# Patient Record
Sex: Male | Born: 1960 | Race: Black or African American | Hispanic: No | Marital: Single | Smoking: Never smoker
Health system: Southern US, Community
[De-identification: ages and names within clinical notes are randomized; demographics above are authoritative.]

## PROBLEM LIST (undated history)

## (undated) DIAGNOSIS — I5023 Acute on chronic systolic (congestive) heart failure: Secondary | ICD-10-CM

## (undated) DIAGNOSIS — I10 Essential (primary) hypertension: Secondary | ICD-10-CM

## (undated) DIAGNOSIS — I4891 Unspecified atrial fibrillation: Secondary | ICD-10-CM

## (undated) DIAGNOSIS — E669 Obesity, unspecified: Secondary | ICD-10-CM

## (undated) DIAGNOSIS — I420 Dilated cardiomyopathy: Secondary | ICD-10-CM

## (undated) HISTORY — DX: Unspecified atrial fibrillation: I48.91

## (undated) HISTORY — DX: Acute on chronic systolic (congestive) heart failure: I50.23

## (undated) HISTORY — DX: Obesity, unspecified: E66.9

## (undated) HISTORY — DX: Dilated cardiomyopathy: I42.0

---

## 2013-11-16 ENCOUNTER — Encounter (HOSPITAL_COMMUNITY): Payer: Self-pay | Admitting: Emergency Medicine

## 2013-11-16 ENCOUNTER — Emergency Department (HOSPITAL_COMMUNITY)
Admission: EM | Admit: 2013-11-16 | Discharge: 2013-11-16 | Disposition: A | Discharge status: Home / Self Care | Payer: Self-pay | Attending: Emergency Medicine | Admitting: Emergency Medicine

## 2013-11-16 DIAGNOSIS — L0291 Cutaneous abscess, unspecified: Secondary | ICD-10-CM | POA: Insufficient documentation

## 2013-11-16 DIAGNOSIS — L0390 Cellulitis, unspecified: Secondary | ICD-10-CM | POA: Insufficient documentation

## 2013-11-16 DIAGNOSIS — I10 Essential (primary) hypertension: Secondary | ICD-10-CM | POA: Insufficient documentation

## 2013-11-16 HISTORY — DX: Essential (primary) hypertension: I10

## 2013-11-16 MED ORDER — CEPHALEXIN 500 MG PO CAPS: 500.0000 mg | ORAL_CAPSULE | Freq: Four times a day (QID) | ORAL | Status: DC

## 2013-11-16 MED ORDER — SULFAMETHOXAZOLE-TRIMETHOPRIM 800-160 MG PO TABS: 1.0000 | ORAL_TABLET | Freq: Two times a day (BID) | ORAL | Status: DC

## 2013-11-16 NOTE — ED Provider Notes (Signed)
CSN: [id number]     Arrival date & time 11/16/13  1013 History  This chart was scribed for non-physician practitioner Emilia BeckKaitlyn Tavis Kring, PA-C working with Dagmar HaitWilliam Blair Walden, MD by Dorothey Basemania Sutton, ED Scribe. This patient was seen in room TR08C/TR08C and the patient's care was started at 11:26 AM.    Chief Complaint  Patient presents with  . Insect Bite   Patient is a 53 y.o. male presenting with rash. The history is provided by the patient. No language interpreter was used.  Rash Location:  Torso and leg Torso rash location:  L chest, R chest, abd LUQ, abd LLQ, abd RUQ and abd RLQ Leg rash location:  L leg and R leg Quality: painful and redness   Pain details:    Severity:  Mild   Onset quality:  Gradual   Timing:  Constant   Progression:  Unchanged Severity:  Moderate Onset quality:  Gradual Timing:  Constant Progression:  Unchanged Chronicity:  New Context: exposure to similar rash   Relieved by:  None tried Ineffective treatments:  None tried  HPI Comments: Thomasene LotJames Gigante is a 53 y.o. male who presents to the Emergency Department complaining of multiple, painful, erythematous bumps to the chest, abdomen, and bilateral legs onset a few days ago. He reports squeezing the areas at home with a small amount of purulent drainage expelled. Patient reports that his girlfriend who shares a bed with him has also had a similar rash on her legs. Patient has a history of HTN.   Past Medical History  Diagnosis Date  . Hypertension    No past surgical history on file. No family history on file. History  Substance Use Topics  . Smoking status: Not on file  . Smokeless tobacco: Not on file  . Alcohol Use: Not on file    Review of Systems  Skin: Positive for color change (erythema) and rash.  All other systems reviewed and are negative.  Allergies  Review of patient's allergies indicates no known allergies.  Home Medications   Prior to Admission medications   Not on File   Triage  Vitals: BP 135/80  Pulse 61  Temp(Src) 97.8 F (36.6 C) (Oral)  Resp 16  Ht 6\' 4"  (1.93 m)  Wt 250 lb (113.399 kg)  BMI 30.44 kg/m2  SpO2 94%  Physical Exam  Nursing note and vitals reviewed. Constitutional: He is oriented to person, place, and time. He appears well-developed and well-nourished. No distress.  HENT:  Head: Normocephalic and atraumatic.  Eyes: Conjunctivae are normal.  Neck: Normal range of motion. Neck supple.  Pulmonary/Chest: Effort normal. No respiratory distress.  Abdominal: He exhibits no distension.  Musculoskeletal: Normal range of motion.  Neurological: He is alert and oriented to person, place, and time.  Skin: Skin is warm and dry. There is erythema.  3 lesions of anterior left thigh that are erythematous and tender to palpation with associated induration. No fluctuance noted. Similar area of central chest.   Psychiatric: He has a normal mood and affect. His behavior is normal.    ED Course  Procedures (including critical care time)  DIAGNOSTIC STUDIES: Oxygen Saturation is 94% on room air, adequate by my interpretation.    COORDINATION OF CARE: 11:28 AM- Discussed that the areas will not need to be incised and drained at this time. Will start patient on a course of antibiotics. Advised of further symptomatic care at home. Discussed treatment plan with patient at bedside and patient verbalized agreement.    Labs  Review Labs Reviewed - No data to display  Imaging Review No results found.   EKG Interpretation None      MDM   Final diagnoses:  Cellulitis    Patient appears to have scattered areas of induration with overlying scabbing from the patient draining the areas at home. No fluctuance noted. Patient will be discharged with Bactrim and Keflex. Vitals stable and patient afebrile.   I personally performed the services described in this documentation, which was scribed in my presence. The recorded information has been reviewed and is  accurate.     Emilia Beck, [place] 11/17/13 [phone]

## 2013-11-16 NOTE — ED Notes (Addendum)
Pt thought he had ingrown hair and tried to pop bumps; reports very painful under the skin lesions. On stomach and chest and legs. GF who sleeps in bed with him has several on her legs. Pt has been picking and pushing on them to make them drain.

## 2013-11-16 NOTE — Discharge Instructions (Signed)
Take Bactrim and Keflex as directed until gone. Refer to attached documents for more information. Return to the ED with worsening or concerning symptoms.  °

## 2013-11-18 NOTE — ED Provider Notes (Signed)
Medical screening examination/treatment/procedure(s) were performed by non-physician practitioner and as supervising physician I was immediately available for consultation/collaboration.   EKG Interpretation None        Dagmar Hait, MD 11/18/13 1901

## 2015-06-24 LAB — HM HIV SCREENING LAB: HM HIV SCREENING: NEGATIVE

## 2016-03-23 DIAGNOSIS — I5023 Acute on chronic systolic (congestive) heart failure: Secondary | ICD-10-CM

## 2016-03-23 HISTORY — DX: Acute on chronic systolic (congestive) heart failure: I50.23

## 2016-03-27 ENCOUNTER — Encounter (HOSPITAL_COMMUNITY): Payer: Self-pay

## 2016-03-27 ENCOUNTER — Emergency Department (HOSPITAL_COMMUNITY): Payer: Medicaid - Out of State

## 2016-03-27 ENCOUNTER — Inpatient Hospital Stay (HOSPITAL_COMMUNITY)
Admission: EM | Admit: 2016-03-27 | Discharge: 2016-04-02 | DRG: 291 | Disposition: A | Discharge status: Home / Self Care | Payer: Medicaid - Out of State | Attending: Cardiovascular Disease | Admitting: Cardiovascular Disease

## 2016-03-27 DIAGNOSIS — E876 Hypokalemia: Secondary | ICD-10-CM | POA: Diagnosis present

## 2016-03-27 DIAGNOSIS — I481 Persistent atrial fibrillation: Secondary | ICD-10-CM | POA: Diagnosis present

## 2016-03-27 DIAGNOSIS — I110 Hypertensive heart disease with heart failure: Principal | ICD-10-CM | POA: Diagnosis present

## 2016-03-27 DIAGNOSIS — I4891 Unspecified atrial fibrillation: Secondary | ICD-10-CM

## 2016-03-27 DIAGNOSIS — R0602 Shortness of breath: Secondary | ICD-10-CM

## 2016-03-27 DIAGNOSIS — Z6831 Body mass index (BMI) 31.0-31.9, adult: Secondary | ICD-10-CM

## 2016-03-27 DIAGNOSIS — J189 Pneumonia, unspecified organism: Secondary | ICD-10-CM | POA: Diagnosis present

## 2016-03-27 DIAGNOSIS — I428 Other cardiomyopathies: Secondary | ICD-10-CM | POA: Diagnosis present

## 2016-03-27 DIAGNOSIS — T50906A Underdosing of unspecified drugs, medicaments and biological substances, initial encounter: Secondary | ICD-10-CM | POA: Diagnosis present

## 2016-03-27 DIAGNOSIS — R0600 Dyspnea, unspecified: Secondary | ICD-10-CM

## 2016-03-27 DIAGNOSIS — Z91128 Patient's intentional underdosing of medication regimen for other reason: Secondary | ICD-10-CM

## 2016-03-27 DIAGNOSIS — I420 Dilated cardiomyopathy: Secondary | ICD-10-CM | POA: Diagnosis present

## 2016-03-27 DIAGNOSIS — I10 Essential (primary) hypertension: Secondary | ICD-10-CM

## 2016-03-27 DIAGNOSIS — I5023 Acute on chronic systolic (congestive) heart failure: Secondary | ICD-10-CM | POA: Diagnosis present

## 2016-03-27 DIAGNOSIS — E669 Obesity, unspecified: Secondary | ICD-10-CM | POA: Diagnosis present

## 2016-03-27 LAB — CBC
HEMATOCRIT: 47 % (ref 39.0–52.0)
Hemoglobin: 16.1 g/dL (ref 13.0–17.0)
MCH: 30.5 pg (ref 26.0–34.0)
MCHC: 34.3 g/dL (ref 30.0–36.0)
MCV: 89 fL (ref 78.0–100.0)
PLATELETS: 217 10*3/uL (ref 150–400)
RBC: 5.28 MIL/uL (ref 4.22–5.81)
RDW: 13.8 % (ref 11.5–15.5)
WBC: 4.7 10*3/uL (ref 4.0–10.5)

## 2016-03-27 LAB — I-STAT ARTERIAL BLOOD GAS, ED
Bicarbonate: 22.3 mmol/L (ref 20.0–28.0)
O2 Saturation: 96 %
PO2 ART: 75 mmHg — AB (ref 83.0–108.0)
Patient temperature: 98.7
TCO2: 23 mmol/L (ref 0–100)
pCO2 arterial: 31 mmHg — ABNORMAL LOW (ref 32.0–48.0)
pH, Arterial: 7.464 — ABNORMAL HIGH (ref 7.350–7.450)

## 2016-03-27 LAB — HEPATIC FUNCTION PANEL
ALBUMIN: 3.7 g/dL (ref 3.5–5.0)
ALT: 50 U/L (ref 17–63)
AST: 45 U/L — ABNORMAL HIGH (ref 15–41)
Alkaline Phosphatase: 86 U/L (ref 38–126)
BILIRUBIN TOTAL: 1.2 mg/dL (ref 0.3–1.2)
Bilirubin, Direct: 0.3 mg/dL (ref 0.1–0.5)
Indirect Bilirubin: 0.9 mg/dL (ref 0.3–0.9)
Total Protein: 6.8 g/dL (ref 6.5–8.1)

## 2016-03-27 LAB — I-STAT TROPONIN, ED: TROPONIN I, POC: 0 ng/mL (ref 0.00–0.08)

## 2016-03-27 LAB — HEPARIN LEVEL (UNFRACTIONATED): Heparin Unfractionated: 0.16 IU/mL — ABNORMAL LOW (ref 0.30–0.70)

## 2016-03-27 LAB — BASIC METABOLIC PANEL
Anion gap: 10 (ref 5–15)
BUN: 13 mg/dL (ref 6–20)
CO2: 23 mmol/L (ref 22–32)
CREATININE: 1.3 mg/dL — AB (ref 0.61–1.24)
Calcium: 9.1 mg/dL (ref 8.9–10.3)
Chloride: 107 mmol/L (ref 101–111)
GFR calc Af Amer: >60 mL/min (ref >60)
GFR, EST NON AFRICAN AMERICAN: 60 mL/min — AB (ref >60)
GLUCOSE: 97 mg/dL (ref 65–99)
POTASSIUM: 3.5 mmol/L (ref 3.5–5.1)
Sodium: 140 mmol/L (ref 135–145)

## 2016-03-27 LAB — MRSA PCR SCREENING: MRSA by PCR: POSITIVE — AB

## 2016-03-27 LAB — PROTIME-INR
INR: 1.05
PROTHROMBIN TIME: 13.7 s (ref 11.4–15.2)

## 2016-03-27 LAB — D-DIMER, QUANTITATIVE (NOT AT ARMC): D DIMER QUANT: 1.54 ug{FEU}/mL — AB (ref 0.00–0.50)

## 2016-03-27 LAB — TROPONIN I
Troponin I: <0.03 ng/mL
Troponin I: <0.03 ng/mL (ref <0.03)

## 2016-03-27 MED ORDER — ACETAMINOPHEN 325 MG PO TABS
650.0000 mg | ORAL_TABLET | ORAL | Status: DC | PRN
  Administered 2016-03-27 – 2016-03-31 (×7): 650 mg via ORAL
  Filled 2016-03-27 (×7): qty 2

## 2016-03-27 MED ORDER — DILTIAZEM HCL 25 MG/5ML IV SOLN
10.0000 mg | Freq: Once | INTRAVENOUS | Status: AC
  Administered 2016-03-27: 10 mg via INTRAVENOUS
  Filled 2016-03-27: qty 5

## 2016-03-27 MED ORDER — HEPARIN BOLUS VIA INFUSION
5500.0000 [IU] | Freq: Once | INTRAVENOUS | Status: AC
  Administered 2016-03-27: 5500 [IU] via INTRAVENOUS
  Filled 2016-03-27: qty 5500

## 2016-03-27 MED ORDER — METOPROLOL TARTRATE 5 MG/5ML IV SOLN
5.0000 mg | Freq: Once | INTRAVENOUS | Status: AC
  Administered 2016-03-27: 5 mg via INTRAVENOUS
  Filled 2016-03-27: qty 5

## 2016-03-27 MED ORDER — SODIUM CHLORIDE 0.9 % IV BOLUS (SEPSIS)
500.0000 mL | Freq: Once | INTRAVENOUS | Status: AC
  Administered 2016-03-27: 500 mL via INTRAVENOUS

## 2016-03-27 MED ORDER — ORAL CARE MOUTH RINSE
15.0000 mL | Freq: Two times a day (BID) | OROMUCOSAL | Status: DC
  Administered 2016-03-28 – 2016-03-31 (×5): 15 mL via OROMUCOSAL

## 2016-03-27 MED ORDER — MILRINONE LACTATE IN DEXTROSE 20-5 MG/100ML-% IV SOLN
0.1250 ug/kg/min | INTRAVENOUS | Status: DC
  Administered 2016-03-27 – 2016-03-30 (×5): 0.125 ug/kg/min via INTRAVENOUS
  Filled 2016-03-27 (×5): qty 100

## 2016-03-27 MED ORDER — AZITHROMYCIN 250 MG PO TABS
500.0000 mg | ORAL_TABLET | ORAL | Status: AC
  Administered 2016-03-27 – 2016-03-29 (×3): 500 mg via ORAL
  Filled 2016-03-27 (×4): qty 2

## 2016-03-27 MED ORDER — DIGOXIN 0.25 MG/ML IJ SOLN: 0.2500 mg | Freq: Every day | INTRAMUSCULAR | Status: DC

## 2016-03-27 MED ORDER — DEXTROSE 5 % IV SOLN
1.0000 g | INTRAVENOUS | Status: DC
  Administered 2016-03-27 – 2016-04-01 (×6): 1 g via INTRAVENOUS
  Filled 2016-03-27 (×6): qty 10

## 2016-03-27 MED ORDER — CHLORHEXIDINE GLUCONATE CLOTH 2 % EX PADS
6.0000 | MEDICATED_PAD | Freq: Every day | CUTANEOUS | Status: AC
  Administered 2016-03-28 – 2016-04-01 (×5): 6 via TOPICAL

## 2016-03-27 MED ORDER — HEPARIN (PORCINE) IN NACL 100-0.45 UNIT/ML-% IJ SOLN: 1000.0000 [IU]/h | INTRAMUSCULAR | Status: DC

## 2016-03-27 MED ORDER — SODIUM CHLORIDE 0.9 % IV SOLN: 250.0000 mL | INTRAVENOUS | Status: DC | PRN

## 2016-03-27 MED ORDER — ONDANSETRON HCL 4 MG/2ML IJ SOLN
4.0000 mg | Freq: Four times a day (QID) | INTRAMUSCULAR | Status: DC | PRN
  Administered 2016-03-29: 4 mg via INTRAVENOUS
  Filled 2016-03-27 (×2): qty 2

## 2016-03-27 MED ORDER — DILTIAZEM HCL 100 MG IV SOLR
5.0000 mg/h | Freq: Once | INTRAVENOUS | Status: DC
  Filled 2016-03-27: qty 100

## 2016-03-27 MED ORDER — HYDRALAZINE HCL 25 MG PO TABS
25.0000 mg | ORAL_TABLET | Freq: Four times a day (QID) | ORAL | Status: DC
  Administered 2016-03-27 – 2016-03-28 (×4): 25 mg via ORAL
  Filled 2016-03-27 (×4): qty 1

## 2016-03-27 MED ORDER — IOPAMIDOL (ISOVUE-370) INJECTION 76%
INTRAVENOUS | Status: AC
  Administered 2016-03-27: 80 mL
  Filled 2016-03-27: qty 100

## 2016-03-27 MED ORDER — NITROGLYCERIN IN D5W 200-5 MCG/ML-% IV SOLN
0.0000 ug/min | INTRAVENOUS | Status: DC
  Administered 2016-03-27: 5 ug/min via INTRAVENOUS
  Administered 2016-03-28: 60 ug/min via INTRAVENOUS
  Administered 2016-03-29: 15 ug/min via INTRAVENOUS
  Filled 2016-03-27 (×3): qty 250

## 2016-03-27 MED ORDER — HEPARIN (PORCINE) IN NACL 100-0.45 UNIT/ML-% IJ SOLN
2150.0000 [IU]/h | INTRAMUSCULAR | Status: DC
  Administered 2016-03-27: 1600 [IU]/h via INTRAVENOUS
  Administered 2016-03-28: 1900 [IU]/h via INTRAVENOUS
  Administered 2016-03-29 – 2016-03-31 (×4): 2150 [IU]/h via INTRAVENOUS
  Filled 2016-03-27 (×9): qty 250

## 2016-03-27 MED ORDER — HEPARIN BOLUS VIA INFUSION: 4000.0000 [IU] | Freq: Once | INTRAVENOUS | Status: DC

## 2016-03-27 MED ORDER — FUROSEMIDE 10 MG/ML IJ SOLN
40.0000 mg | Freq: Two times a day (BID) | INTRAMUSCULAR | Status: DC
  Administered 2016-03-27 – 2016-03-28 (×2): 40 mg via INTRAVENOUS
  Filled 2016-03-27 (×2): qty 4

## 2016-03-27 MED ORDER — SODIUM CHLORIDE 0.9% FLUSH
3.0000 mL | Freq: Two times a day (BID) | INTRAVENOUS | Status: DC
  Administered 2016-03-27 – 2016-04-01 (×6): 3 mL via INTRAVENOUS

## 2016-03-27 MED ORDER — MUPIROCIN 2 % EX OINT
1.0000 "application " | TOPICAL_OINTMENT | Freq: Two times a day (BID) | CUTANEOUS | Status: AC
  Administered 2016-03-28 – 2016-04-01 (×10): 1 via NASAL
  Filled 2016-03-27 (×5): qty 22

## 2016-03-27 MED ORDER — SODIUM CHLORIDE 0.9% FLUSH: 3.0000 mL | INTRAVENOUS | Status: DC | PRN

## 2016-03-27 NOTE — ED Notes (Signed)
Report attempted 

## 2016-03-27 NOTE — Progress Notes (Signed)
BP 163/114 at 2230. HR afib rate controlled 85-105 and patient denies any SOB. Titration of Nitro gtt not effecting BP. Received scheduled hydralazine also. Patient now has headache 5/10 which tylenol given for. Advised Dr Algie Coffer who stated that he was okay with BP at that level. Patient bothered by nasal cannula and requests mask instead. Will trial of oxygen.

## 2016-03-27 NOTE — Progress Notes (Signed)
ANTICOAGULATION CONSULT NOTE - Initial Consult  Pharmacy Consult for Heparin Indication: atrial fibrillation (r/o PE)  No Known Allergies  Patient Measurements: Height: 6' 3.98" (193 cm) Weight: 267 lb (121.1 kg) IBW/kg (Calculated) : 86.76 Heparin Dosing Weight: 112.2  Vital Signs: Temp: 98.7 F (37.1 C) (10/05 1022) Temp Source: Oral (10/05 1022) BP: 171/139 (10/05 1200) Pulse Rate: 28 (10/05 1200)  Labs:  Recent Labs  03/27/16 1025  HGB 16.1  HCT 47.0  PLT 217  LABPROT 13.7  INR 1.05  CREATININE 1.30*    Estimated Creatinine Clearance: 90.2 mL/min (by C-G formula based on SCr of 1.3 mg/dL (H)).   Medical History: Past Medical History:  Diagnosis Date  . Hypertension     Medications:   (Not in a hospital admission)  Assessment: Pt has dx Afib and admits to poor compliance of anticoag meds. Last took warfarin 2 weeks ago. SOB of breath may be d/t new onset PE, will r/o with chest CT. Anticoagulation dosing based on Afib and potential PE. H/H, Plt WNL. D-dimer elevated at 1.54. INR WNL.   Goal of Therapy:  Heparin level 0.3-0.7 units/ml Monitor platelets by anticoagulation protocol: Yes   Plan:  Give 5500 units bolus x 1  Heparin infusion 1600 units/hr Obtain HL @ 2200 Obtain daily HL, CBC Monitor for s/sx bleeding  Lianne Cure 03/27/2016,1:37 PM

## 2016-03-27 NOTE — ED Triage Notes (Addendum)
Pt has been taking valsartan and metoprolol x 6-7 months after dx w/ HTN by PCP. Pt c/o shortness of breath starting 1 week ago. Pt states he looked online and saw shortness of breath as side effect of medications, stopped taking meds and did not consult with PCP. Pt hypertensive upon arrival to ED. Unable to sleep d/t difficulty breathing. Symptoms worse w/ exertion. Denies CP/cough/fever/chills. Hx afib.

## 2016-03-27 NOTE — H&P (Signed)
Referring Physician:  [name] is an 55 y.o. male.                       Chief Complaint: Shortness of breath  HPI: 55 year old male with hypertension, dilated cardiomyopathy who was cardioverted for atrial fibrillation 2 weeks ago has stopped his Beta blocker due to shortness of breath. He is back in atrial fibrillation and has increased dyspnea x 1 week. Chest x-ray showed cardiomegaly and CT angio-chest showed pulmonary edema and cardiomegaly.  Past Medical History:  Diagnosis Date  . Hypertension       History reviewed. No pertinent surgical history.  History reviewed. No pertinent family history. Social History:  reports that he has never smoked. He has never used smokeless tobacco. He reports that he does not drink alcohol or use drugs.  Allergies: No Known Allergies   (Not in a hospital admission)  Results for orders placed or performed during the hospital encounter of 03/27/16 (from the past 48 hour(s))  Basic metabolic panel     Status: Abnormal   Collection Time: 03/27/16 10:25 AM  Result Value Ref Range   Sodium 140 135 - 145 mmol/L   Potassium 3.5 3.5 - 5.1 mmol/L   Chloride 107 101 - 111 mmol/L   CO2 23 22 - 32 mmol/L   Glucose, Bld 97 65 - 99 mg/dL   BUN 13 6 - 20 mg/dL   Creatinine, Ser 1.30 (H) 0.61 - 1.24 mg/dL   Calcium 9.1 8.9 - 10.3 mg/dL   GFR calc non Af Amer 60 (L) >60 mL/min   GFR calc Af Amer >60 >60 mL/min    Comment: (NOTE) The eGFR has been calculated using the CKD EPI equation. This calculation has not been validated in all clinical situations. eGFR's persistently <60 mL/min signify possible Chronic Kidney Disease.    Anion gap 10 5 - 15  CBC     Status: None   Collection Time: 03/27/16 10:25 AM  Result Value Ref Range   WBC 4.7 4.0 - 10.5 K/uL   RBC 5.28 4.22 - 5.81 MIL/uL   Hemoglobin 16.1 13.0 - 17.0 g/dL   HCT 47.0 39.0 - 52.0 %   MCV 89.0 78.0 - 100.0 fL   MCH 30.5 26.0 - 34.0 pg   MCHC 34.3 30.0 - 36.0 g/dL   RDW 13.8 11.5 -  15.5 %   Platelets 217 150 - 400 K/uL  Hepatic function panel     Status: Abnormal   Collection Time: 03/27/16 10:25 AM  Result Value Ref Range   Total Protein 6.8 6.5 - 8.1 g/dL   Albumin 3.7 3.5 - 5.0 g/dL   AST 45 (H) 15 - 41 U/L   ALT 50 17 - 63 U/L   Alkaline Phosphatase 86 38 - 126 U/L   Total Bilirubin 1.2 0.3 - 1.2 mg/dL   Bilirubin, Direct 0.3 0.1 - 0.5 mg/dL   Indirect Bilirubin 0.9 0.3 - 0.9 mg/dL  Protime-INR     Status: None   Collection Time: 03/27/16 10:25 AM  Result Value Ref Range   Prothrombin Time 13.7 11.4 - 15.2 seconds   INR 1.05   D-dimer, quantitative (not at [facility])     Status: Abnormal   Collection Time: 03/27/16 10:25 AM  Result Value Ref Range   D-Dimer, Quant 1.54 (H) 0.00 - 0.50 ug/mL-FEU    Comment: (NOTE) At the manufacturer cut-off of 0.50 ug/mL FEU, this assay has been documented to  exclude PE with a sensitivity and negative predictive value of 97 to 99%.  At this time, this assay has not been approved by the FDA to exclude DVT/VTE. Results should be correlated with clinical presentation.   I-stat troponin, ED     Status: None   Collection Time: 03/27/16 11:00 AM  Result Value Ref Range   Troponin i, poc 0.00 0.00 - 0.08 ng/mL   Comment 3            Comment: Due to the release kinetics of cTnI, a negative result within the first hours of the onset of symptoms does not rule out myocardial infarction with certainty. If myocardial infarction is still suspected, repeat the test at appropriate intervals.   I-Stat Arterial Blood Gas, ED - (order at [facility] and [facility] only)     Status: Abnormal   Collection Time: 03/27/16  1:15 PM  Result Value Ref Range   pH, Arterial 7.464 (H) 7.350 - 7.450   pCO2 arterial 31.0 (L) 32.0 - 48.0 mmHg   pO2, Arterial 75.0 (L) 83.0 - 108.0 mmHg   Bicarbonate 22.3 20.0 - 28.0 mmol/L   TCO2 23 0 - 100 mmol/L   O2 Saturation 96.0 %   Patient temperature 98.7 F    Collection site RADIAL, ALLEN'S TEST ACCEPTABLE    Drawn  by Operator    Sample type ARTERIAL    Dg Chest Portable 1 View  Result Date: 03/27/2016 CLINICAL DATA:  Short of breath for several months EXAM: PORTABLE CHEST 1 VIEW COMPARISON:  None. FINDINGS: No pneumonia or effusion is seen. It is difficult to exclude very minimal interstitial prominence, but some of this prominence most likely is due to technique and inspiration. If further assessment is warranted CT of the chest would be recommended. Mediastinal and hilar contours are unremarkable. The heart is mildly enlarged. No bony abnormality is seen. IMPRESSION: 1. No definite active process. Doubt slight prominence of interstitial markings but consider CT of the chest if warranted. 2. Cardiomegaly. Electronically Signed   By: Ivar Drape M.D.   On: 03/27/2016 10:43    Review Of Systems Constitutional: No fever or chills Respiratory: Positive Shortness of breath. No asthma or COPD. Cardiovascular: Positive Hypertension, atrial fibrillation and CHF. GI: No hepatitis, GI bleed or nausea, vomiting or abdominal pain GU: No hematuria or dysuria.  Skin: No rash. Neurology: No headache, dizziness, stroke or seizures. Psychiatry: No anxiety or depression.  Blood pressure (!) 171/139, pulse (!) 28, temperature 98.7 F (37.1 C), temperature source Oral, resp. rate 23, height 6' 3.98" (1.93 m), weight 121.1 kg (267 lb), SpO2 93 %. Body mass index is 32.51 kg/m. General appearance: alert, cooperative, appears stated age and severe distress Head: Normocephalic, without obvious abnormality, atraumatic Eyes: conjunctivae/corneas clear. PERRL, EOM's intact. Fundi benign. Neck: no adenopathy, no carotid bruit, + JVD, supple, symmetrical, trachea midline and thyroid not enlarged. Resp: crackles to auscultation bilaterally Cardio: Irregular rate and rhythm, S1, S2 normal, no click, rub or gallop. II/VI systolic murmur. Extremities: No cyanosis. Positive trace edema Skin: Warm and dry. No rashes or  lesions Neurologic: Alert and oriented X 3, normal strength and tone. Moves all four extremities.  Assessment/Plan Acute on chronic systolic heart failure Atrial fibrillation with RVR to moderate control, CHADVASC score of 2 Hypertension Dilated cardiomyopathy  Admit to ICU with NTG and milrinone drip. Small dose B-blocker or diltiazem as needed for rate control.  Birdie Riddle, MD  03/27/2016, 2:34 PM

## 2016-03-27 NOTE — ED Provider Notes (Addendum)
[facility]-EMERGENCY DEPT Provider Note   CSN: [id number] Arrival date & time: 03/27/16  1005     History   Chief Complaint Chief Complaint  Patient presents with  . Shortness of Breath    HPI Dwayne Ramirez is a 55 y.o. male.  HPI  The 55 year old man history of hypertension, A. fib who presents today stating that he has had increasing dyspnea over the past week. He reports baseline dyspnea for a number of years. He states that he quit taking his blood pressure medicines a week ago because they were not doing him any good and he continued to be short of breath. He reports that he has had atrial fibrillation and [place] 2 [place]. He states that he was taken off of blood thinners at that time. He states he has had some cough but he has not had running a fever or had a productive cough. He denies smoking or drug use.  Past Medical History:  Diagnosis Date  . Hypertension     There are no active problems to display for this patient.   History reviewed. No pertinent surgical history.     Home Medications    Prior to Admission medications   Medication Sig Start Date End Date Taking? Authorizing Provider  cephALEXin (KEFLEX) 500 MG capsule Take 1 capsule (500 mg total) by mouth 4 (four) times daily. 11/16/13   Kaitlyn Szekalski, PA-C  sulfamethoxazole-trimethoprim (SEPTRA DS) 800-160 MG per tablet Take 1 tablet by mouth every 12 (twelve) hours. 11/16/13   Emilia BeckKaitlyn Szekalski, PA-C    Family History History reviewed. No pertinent family history.  Social History Social History  Substance Use Topics  . Smoking status: Never Smoker  . Smokeless tobacco: Never Used  . Alcohol use No     Allergies   Review of patient's allergies indicates no known allergies.   Review of Systems Review of Systems   Physical Exam Updated Vital Signs BP (!) 176/142 (BP Location: Left Arm)   Pulse 66   Temp 98.7 F (37.1 C) (Oral)   Resp 23   SpO2 95%   Physical  Exam  Constitutional: He is oriented to person, place, and time. He appears well-developed and well-nourished.  HENT:  Head: Normocephalic and atraumatic.  Right Ear: External ear normal.  Left Ear: External ear normal.  Nose: Nose normal.  Eyes: EOM are normal.  Neck: No tracheal deviation present.  Cardiovascular: Normal rate, regular rhythm, normal heart sounds and intact distal pulses.   Pulmonary/Chest: Effort normal.  Abdominal: Soft. Bowel sounds are normal.  Musculoskeletal: Normal range of motion. He exhibits no edema.  Neurological: He is alert and oriented to person, place, and time.  Skin: Skin is warm and dry.  Psychiatric: He has a normal mood and affect. His behavior is normal.  Nursing note and vitals reviewed.    ED Treatments / Results  Labs (all labs ordered are listed, but only abnormal results are displayed) Labs Reviewed  BASIC METABOLIC PANEL  CBC  HEPATIC FUNCTION PANEL  PROTIME-INR  I-STAT TROPOININ, ED    EKG  EKG Interpretation  Date/Time:  Thursday March 27 2016 10:17:44 EDT Ventricular Rate:  117 PR Interval:    QRS Duration: 103 QT Interval:  336 QTC Calculation: 469 R Axis:   -53 Text Interpretation:  Atrial fibrillation Incomplete RBBB and LAFB Abnormal R-wave progression, early transition TW inversions in leads I, V5, V6, possibly rate-dependent or due to repolarization abnormalities No ST elevations or depressions  No old tracing to compare Confirmed by [facility] MD, CAMERON [phone]) on 03/27/2016 10:23:05 AM       Radiology Dg Chest Portable 1 View  Result Date: 03/27/2016 CLINICAL DATA:  Short of breath for several months EXAM: PORTABLE CHEST 1 VIEW COMPARISON:  None. FINDINGS: No pneumonia or effusion is seen. It is difficult to exclude very minimal interstitial prominence, but some of this prominence most likely is due to technique and inspiration. If further assessment is warranted CT of the chest would be recommended. Mediastinal  and hilar contours are unremarkable. The heart is mildly enlarged. No bony abnormality is seen. IMPRESSION: 1. No definite active process. Doubt slight prominence of interstitial markings but consider CT of the chest if warranted. 2. Cardiomegaly. Electronically Signed   By: Dwyane Dee M.D.   On: 03/27/2016 10:43    Procedures Procedures (including critical care time)  Medications Ordered in ED Medications  metoprolol (LOPRESSOR) injection 5 mg (not administered)     Initial Impression / Assessment and Plan / ED Course  I have reviewed the triage vital signs and the nursing notes.  Pertinent labs & imaging results that were available during my care of the patient were reviewed by me and considered in my medical decision making (see chart for details).  Clinical Course   1- a fib rvr- patient with rate decreased to 90s here before lopressor, lopressor iv given.  Will need cardioversion or rate control agent.  Discussed need for anticoagulation and patient states he does not want tobe on blood thinner.  Did agree to initial anticoagulation while continuing work up.  CHA2Ds2-VASc Score for Atrial Fibrillation    Patient Score  Age <65 = 0 65-74 = 1 > 75 = 2 0  Sex Male = 0 Male = 1 0  CHF History No = 0  Yes = 1 0  HTN History No = 0  Yes = 1 1  Stroke/TIA/TE History No = 0  Yes = 1 0  Vascular Disease History No = 0  Yes = 1 0  Diabetes History No = 0  Yes = 1 0  Total:  1      2- dyspnea- #1 could be contributing, but no overt signs of chf and patient with sats as low as 87%.  Plan ct angio to assess for pe with elevated d-dimer.  Patient denies smoking but states has been wheezing.  CXR without pulmonary edema- but could have some mild increased fluid contributing to dyspnea 3- hypertension 4 noncompliance with meds- lpatient with complaints of meds causing symptoms, but history unclear if symptoms from underlying disease or meds.  Appears challenging to explain  physiology/pharmacotherapy and obtain patient compliance.   Discussed with Dr. Algie Coffer and will start cardizem.  He will see and assumes care.  1:23 PM   CRITICAL CARE Performed by: Hilario Quarry Total critical care time: 60 minutes Critical care time was exclusive of separately billable procedures and treating other patients. Critical care was necessary to treat or prevent imminent or life-threatening deterioration. Critical care was time spent personally by me on the following activities: development of treatment plan with patient and/or surrogate as well as nursing, discussions with consultants, evaluation of patient's response to treatment, examination of patient, obtaining history from patient or surrogate, ordering and performing treatments and interventions, ordering and review of laboratory studies, ordering and review of radiographic studies, pulse oximetry and re-evaluation of patient's condition.  Final Clinical Impressions(s) / ED Diagnoses   Final diagnoses:  Atrial fibrillation with RVR (HCC)  Dyspnea, unspecified type  Hypertension, unspecified type    New Prescriptions New Prescriptions   No medications on file     Margarita Grizzle, MD 03/27/16 1324    Margarita Grizzle, MD 03/27/16 1538

## 2016-03-27 NOTE — ED Notes (Signed)
Report called  

## 2016-03-27 NOTE — Progress Notes (Signed)
ANTICOAGULATION CONSULT NOTE - Follow Up Consult  Pharmacy Consult for heparin  Indication: atrial fibrillation (r/o PE)  No Known Allergies  Patient Measurements: Height: 6' 3.98" (193 cm) Weight: 264 lb 12.4 oz (120.1 kg) IBW/kg (Calculated) : 86.76 Heparin Dosing Weight: 112.2 kg  Vital Signs: Temp: 97.8 F (36.6 C) (10/05 1723) Temp Source: Oral (10/05 1723) BP: 172/147 (10/05 2000) Pulse Rate: 104 (10/05 2000)  Labs:  Recent Labs  03/27/16 1025 03/27/16 1548 03/27/16 1954  HGB 16.1  --   --   HCT 47.0  --   --   PLT 217  --   --   LABPROT 13.7  --   --   INR 1.05  --   --   HEPARINUNFRC  --   --  0.16*  CREATININE 1.30*  --   --   TROPONINI  --  <0.03  --     Estimated Creatinine Clearance: 89.8 mL/min (by C-G formula based on SCr of 1.3 mg/dL (H)).   Medications:  Scheduled:  . azithromycin  500 mg Oral Q24H  . cefTRIAXone (ROCEPHIN)  IV  1 g Intravenous Q24H  . furosemide  40 mg Intravenous BID  . hydrALAZINE  25 mg Oral Q6H  . mouth rinse  15 mL Mouth Rinse BID  . sodium chloride flush  3 mL Intravenous Q12H   Infusions:  . heparin 1,600 Units/hr (03/27/16 1440)  . milrinone 0.125 mcg/kg/min (03/27/16 1625)  . nitroGLYCERIN 25 mcg/min (03/27/16 1859)    Assessment: 55 yo male with afib (r/o PE) is currently on subtherapeutic heparin. Heparin level is 0.16. No problem with infusion per RN.  Goal of Therapy:  Heparin level 0.3-0.7 units/ml Monitor platelets by anticoagulation protocol: Yes   Plan:  - increase heparin drip to 1900 units/hr - 6hr heparin level  Jaine Estabrooks, Tsz-Yin 03/27/2016,8:37 PM

## 2016-03-27 NOTE — ED Notes (Signed)
Maurice Small, RN verified Heparin when started

## 2016-03-27 NOTE — Progress Notes (Signed)
Patient's wife brought patient drink from home. Reviewed 1200 ml fluid restriction and unit visitation policy. Discussed medications patient was taking at hospital with wife and patient. Wife appears more receptive to teaching, asking questions while patient watched television. Explained the importance of fluid restriction and patient stated "If I drink more I will just pee it out".

## 2016-03-28 ENCOUNTER — Inpatient Hospital Stay (HOSPITAL_COMMUNITY): Payer: Medicaid - Out of State

## 2016-03-28 LAB — HEPARIN LEVEL (UNFRACTIONATED)
HEPARIN UNFRACTIONATED: 0.48 [IU]/mL (ref 0.30–0.70)
HEPARIN UNFRACTIONATED: 0.4 [IU]/mL (ref 0.30–0.70)
Heparin Unfractionated: 0.24 IU/mL — ABNORMAL LOW (ref 0.30–0.70)

## 2016-03-28 LAB — CBC
HCT: 43.9 % (ref 39.0–52.0)
Hemoglobin: 14.6 g/dL (ref 13.0–17.0)
MCH: 29.6 pg (ref 26.0–34.0)
MCHC: 33.3 g/dL (ref 30.0–36.0)
MCV: 88.9 fL (ref 78.0–100.0)
PLATELETS: 176 10*3/uL (ref 150–400)
RBC: 4.94 MIL/uL (ref 4.22–5.81)
RDW: 13.5 % (ref 11.5–15.5)
WBC: 5.8 10*3/uL (ref 4.0–10.5)

## 2016-03-28 LAB — TROPONIN I

## 2016-03-28 LAB — BASIC METABOLIC PANEL
ANION GAP: 10 (ref 5–15)
BUN: 16 mg/dL (ref 6–20)
CALCIUM: 8.8 mg/dL — AB (ref 8.9–10.3)
CO2: 25 mmol/L (ref 22–32)
CREATININE: 1.35 mg/dL — AB (ref 0.61–1.24)
Chloride: 105 mmol/L (ref 101–111)
GFR, EST NON AFRICAN AMERICAN: 57 mL/min — AB (ref >60)
Glucose, Bld: 100 mg/dL — ABNORMAL HIGH (ref 65–99)
Potassium: 3.2 mmol/L — ABNORMAL LOW (ref 3.5–5.1)
SODIUM: 140 mmol/L (ref 135–145)

## 2016-03-28 LAB — ECHOCARDIOGRAM COMPLETE
HEIGHTINCHES: 75.984 in
WEIGHTICAEL: 4204.8 [oz_av]

## 2016-03-28 MED ORDER — HYDRALAZINE HCL 50 MG PO TABS
50.0000 mg | ORAL_TABLET | Freq: Three times a day (TID) | ORAL | Status: DC
  Administered 2016-03-28 – 2016-03-29 (×3): 50 mg via ORAL
  Filled 2016-03-28 (×3): qty 1

## 2016-03-28 MED ORDER — FUROSEMIDE 10 MG/ML IJ SOLN
40.0000 mg | Freq: Every day | INTRAMUSCULAR | Status: DC
  Administered 2016-03-29 – 2016-04-01 (×4): 40 mg via INTRAVENOUS
  Filled 2016-03-28 (×4): qty 4

## 2016-03-28 MED ORDER — OXYCODONE HCL 5 MG PO TABS
5.0000 mg | ORAL_TABLET | Freq: Four times a day (QID) | ORAL | Status: DC | PRN
  Administered 2016-03-28 – 2016-03-31 (×3): 5 mg via ORAL
  Filled 2016-03-28 (×5): qty 1

## 2016-03-28 MED ORDER — POTASSIUM CHLORIDE CRYS ER 20 MEQ PO TBCR
20.0000 meq | EXTENDED_RELEASE_TABLET | ORAL | Status: AC
  Administered 2016-03-28 (×3): 20 meq via ORAL
  Filled 2016-03-28 (×2): qty 1

## 2016-03-28 MED ORDER — POTASSIUM CHLORIDE CRYS ER 20 MEQ PO TBCR
20.0000 meq | EXTENDED_RELEASE_TABLET | ORAL | Status: AC
  Administered 2016-03-28 – 2016-03-29 (×2): 20 meq via ORAL
  Filled 2016-03-28 (×2): qty 1

## 2016-03-28 MED ORDER — LOSARTAN POTASSIUM 25 MG PO TABS
25.0000 mg | ORAL_TABLET | Freq: Every day | ORAL | Status: DC
  Administered 2016-03-28 – 2016-03-29 (×2): 25 mg via ORAL
  Filled 2016-03-28 (×2): qty 1

## 2016-03-28 MED ORDER — SPIRONOLACTONE 25 MG PO TABS
12.5000 mg | ORAL_TABLET | Freq: Two times a day (BID) | ORAL | Status: DC
Start: 2016-03-28 — End: 2016-04-02
  Administered 2016-03-28 – 2016-04-02 (×10): 12.5 mg via ORAL
  Filled 2016-03-28 (×10): qty 1

## 2016-03-28 MED ORDER — FUROSEMIDE 10 MG/ML IJ SOLN
40.0000 mg | Freq: Once | INTRAMUSCULAR | Status: AC
  Administered 2016-03-28: 40 mg via INTRAVENOUS
  Filled 2016-03-28: qty 4

## 2016-03-28 MED ORDER — DILTIAZEM HCL 30 MG PO TABS
30.0000 mg | ORAL_TABLET | Freq: Four times a day (QID) | ORAL | Status: DC
  Administered 2016-03-28 (×2): 30 mg via ORAL
  Filled 2016-03-28 (×2): qty 1

## 2016-03-28 MED ORDER — POTASSIUM CHLORIDE CRYS ER 10 MEQ PO TBCR
EXTENDED_RELEASE_TABLET | ORAL | Status: AC
  Filled 2016-03-28: qty 2

## 2016-03-28 MED ORDER — DILTIAZEM HCL 60 MG PO TABS
60.0000 mg | ORAL_TABLET | Freq: Four times a day (QID) | ORAL | Status: DC
  Administered 2016-03-29 (×2): 60 mg via ORAL
  Filled 2016-03-28 (×2): qty 1

## 2016-03-28 NOTE — Progress Notes (Signed)
ANTICOAGULATION CONSULT NOTE - Follow Up Consult  Pharmacy Consult for heparin  Indication: atrial fibrillation  No Known Allergies  Patient Measurements: Height: 6' 3.98" (193 cm) Weight: 264 lb 12.4 oz (120.1 kg) IBW/kg (Calculated) : 86.76 Heparin Dosing Weight: 112.2 kg  Vital Signs: Temp: 98 F (36.7 C) (10/05 2300) Temp Source: Oral (10/05 2100) BP: 144/98 (10/06 0200) Pulse Rate: 60 (10/06 0200)  Labs:  Recent Labs  03/27/16 1025 03/27/16 1548 03/27/16 1954 03/28/16 0231  HGB 16.1  --   --  14.6  HCT 47.0  --   --  43.9  PLT 217  --   --  176  LABPROT 13.7  --   --   --   INR 1.05  --   --   --   HEPARINUNFRC  --   --  0.16* 0.24*  CREATININE 1.30*  --   --   --   TROPONINI  --  <0.03 <0.03 <0.03    Estimated Creatinine Clearance: 89.8 mL/min (by C-G formula based on SCr of 1.3 mg/dL (H)).  Assessment: 55 yo male on heparin for afib. No PE seen on CT scan. Heparin level remains subtherapeutic (0.24) on 1900 units/hr. No issues with line or bleeding reported per RN.  Goal of Therapy:  Heparin level 0.3-0.7 units/ml Monitor platelets by anticoagulation protocol: Yes   Plan:  - increase heparin drip to 2150 units/hr - F/u 6 hr heparin level  Christoper Fabian, PharmD, BCPS Clinical pharmacist, pager [phone] 03/28/2016,3:38 AM

## 2016-03-28 NOTE — Progress Notes (Signed)
Pt continued on contact precautions for positive MRSA pcr from previous shift. Family educated about contact gowns, infection control policy and procedures. Protocol initiated on 10/5.

## 2016-03-28 NOTE — Progress Notes (Signed)
ANTICOAGULATION CONSULT NOTE - Follow Up Consult  Pharmacy Consult for heparin Indication: atrial fibrillation  No Known Allergies  Patient Measurements: Height: 6' 3.98" (193 cm) Weight: 262 lb 12.8 oz (119.2 kg) IBW/kg (Calculated) : 86.76 Heparin Dosing Weight: 112.2 kg  Vital Signs: Temp: 98.1 F (36.7 C) (10/06 1207) Temp Source: Oral (10/06 1207) BP: 148/101 (10/06 1500) Pulse Rate: 97 (10/06 1500)  Labs:  Recent Labs  03/27/16 1025 03/27/16 1548 03/27/16 1954 03/28/16 0231 03/28/16 1454  HGB 16.1  --   --  14.6  --   HCT 47.0  --   --  43.9  --   PLT 217  --   --  176  --   LABPROT 13.7  --   --   --   --   INR 1.05  --   --   --   --   HEPARINUNFRC  --   --  0.16* 0.24* 0.48  CREATININE 1.30*  --   --  1.35*  --   TROPONINI  --  <0.03 <0.03 <0.03  --     Estimated Creatinine Clearance: 86.2 mL/min (by C-G formula based on SCr of 1.35 mg/dL (H)).   Medications:  Scheduled:  . azithromycin  500 mg Oral Q24H  . cefTRIAXone (ROCEPHIN)  IV  1 g Intravenous Q24H  . Chlorhexidine Gluconate Cloth  6 each Topical Q0600  . diltiazem  30 mg Oral Q6H  . [START ON 03/29/2016] furosemide  40 mg Intravenous Daily  . hydrALAZINE  50 mg Oral Q8H  . mouth rinse  15 mL Mouth Rinse BID  . mupirocin ointment  1 application Nasal BID  . sodium chloride flush  3 mL Intravenous Q12H   Infusions:  . heparin 2,150 Units/hr (03/28/16 1200)  . milrinone 0.125 mcg/kg/min (03/28/16 1200)  . nitroGLYCERIN 30 mcg/min (03/28/16 1435)    Assessment: 55 yo male with afib is currently on therapeutic heparin.  Heparin level is 0.48.   Goal of Therapy:  Heparin level 0.3-0.7 units/ml Monitor platelets by anticoagulation protocol: Yes   Plan:  - continue heparin at 2150 units/hr - repeat in 6hrs to confirm  Javelle Donigan, Tsz-Yin 03/28/2016,3:30 PM

## 2016-03-28 NOTE — Progress Notes (Signed)
ANTICOAGULATION CONSULT NOTE - Follow Up Consult  Pharmacy Consult for heparin Indication: atrial fibrillation  Labs:  Recent Labs  03/27/16 1025 03/27/16 1548  03/27/16 1954 03/28/16 0231 03/28/16 1454 03/28/16 2147  HGB 16.1  --   --   --  14.6  --   --   HCT 47.0  --   --   --  43.9  --   --   PLT 217  --   --   --  176  --   --   LABPROT 13.7  --   --   --   --   --   --   INR 1.05  --   --   --   --   --   --   HEPARINUNFRC  --   --   < > 0.16* 0.24* 0.48 0.40  CREATININE 1.30*  --   --   --  1.35*  --   --   TROPONINI  --  <0.03  --  <0.03 <0.03  --   --   < > = values in this interval not displayed.   Assessment/Plan:  55yo male remains therapeutic on heparin. Will continue gtt at current rate and daily levels.   Vernard Gambles, PharmD, BCPS  03/28/2016,11:35 PM

## 2016-03-28 NOTE — Progress Notes (Signed)
Pt BP 167/114. Notified Dr. Algie Coffer, received verbal orders for diltiazem 30mg  PO q6hr.

## 2016-03-28 NOTE — Progress Notes (Signed)
Echocardiogram 2D Echocardiogram has been performed.  Dwayne Ramirez 03/28/2016, 10:51 AM

## 2016-03-28 NOTE — Progress Notes (Signed)
Ref: Pcp Not In System   Subjective:  Breathing better. Echocardiogram showed mild inferior and lateral wall hypokinesia with 40-45 % EF, mild MR and Severe TR.  Objective:  Vital Signs in the last 24 hours: Temp:  [97.8 F (36.6 C)-98.5 F (36.9 C)] 97.8 F (36.6 C) (10/06 2040) Pulse Rate:  [36-111] 97 (10/06 2040) Resp:  [11-27] 21 (10/06 2040) BP: (123-171)/(87-147) 164/96 (10/06 2040) SpO2:  [90 %-98 %] 97 % (10/06 2040) Weight:  [119.2 kg (262 lb 12.8 oz)] 119.2 kg (262 lb 12.8 oz) (10/06 0500)  Physical Exam: BP Readings from Last 1 Encounters:  03/28/16 (!) 164/96    Wt Readings from Last 1 Encounters:  03/28/16 119.2 kg (262 lb 12.8 oz)    Weight change:  Body mass index is 32 kg/m. HEENT: [place]/AT, Eyes-Brown, PERL, EOMI, Conjunctiva-Pink, Sclera-Non-icteric Neck: + JVD, No bruit, Trachea midline. Lungs:  Clearing, Bilateral. Cardiac: Irregular rhythm, normal S1 and S2, no S3. II/VI systolic murmur. Abdomen:  Soft, non-tender. Extremities:  No edema present. No cyanosis. No clubbing. CNS: AxOx3, Cranial nerves grossly intact, moves all 4 extremities.  Skin: Warm and dry.   Intake/Output from previous day: 10/05 0701 - 10/06 0700 In: 1691.2 [P.O.:580; I.V.:561.2; IV Piggyback:550] Out: 2775 [Urine:2775]    Lab Results: BMET    Component Value Date/Time   NA 140 03/28/2016 0231   NA 140 03/27/2016 1025   K 3.2 (L) 03/28/2016 0231   K 3.5 03/27/2016 1025   CL 105 03/28/2016 0231   CL 107 03/27/2016 1025   CO2 25 03/28/2016 0231   CO2 23 03/27/2016 1025   GLUCOSE 100 (H) 03/28/2016 0231   GLUCOSE 97 03/27/2016 1025   BUN 16 03/28/2016 0231   BUN 13 03/27/2016 1025   CREATININE 1.35 (H) 03/28/2016 0231   CREATININE 1.30 (H) 03/27/2016 1025   CALCIUM 8.8 (L) 03/28/2016 0231   CALCIUM 9.1 03/27/2016 1025   GFRNONAA 57 (L) 03/28/2016 0231   GFRNONAA 60 (L) 03/27/2016 1025   GFRAA >60 03/28/2016 0231   GFRAA >60 03/27/2016 1025   CBC    Component  Value Date/Time   WBC 5.8 03/28/2016 0231   RBC 4.94 03/28/2016 0231   HGB 14.6 03/28/2016 0231   HCT 43.9 03/28/2016 0231   PLT 176 03/28/2016 0231   MCV 88.9 03/28/2016 0231   MCH 29.6 03/28/2016 0231   MCHC 33.3 03/28/2016 0231   RDW 13.5 03/28/2016 0231   HEPATIC Function Panel  Recent Labs  03/27/16 1025  PROT 6.8   HEMOGLOBIN A1C No components found for: HGA1C,  MPG CARDIAC ENZYMES Lab Results  Component Value Date   TROPONINI <0.03 03/28/2016   TROPONINI <0.03 03/27/2016   TROPONINI <0.03 03/27/2016   BNP No results for input(s): PROBNP in the last 8760 hours. TSH No results for input(s): TSH in the last 8760 hours. CHOLESTEROL No results for input(s): CHOL in the last 8760 hours.  Scheduled Meds: . azithromycin  500 mg Oral Q24H  . cefTRIAXone (ROCEPHIN)  IV  1 g Intravenous Q24H  . Chlorhexidine Gluconate Cloth  6 each Topical Q0600  . diltiazem  30 mg Oral Q6H  . [START ON 03/29/2016] furosemide  40 mg Intravenous Daily  . furosemide  40 mg Intravenous Once  . hydrALAZINE  50 mg Oral Q8H  . mouth rinse  15 mL Mouth Rinse BID  . mupirocin ointment  1 application Nasal BID  . potassium chloride  20 mEq Oral Q2H  . sodium chloride  flush  3 mL Intravenous Q12H   Continuous Infusions: . heparin 2,150 Units/hr (03/28/16 1700)  . milrinone 0.125 mcg/kg/min (03/28/16 1700)  . nitroGLYCERIN 30 mcg/min (03/28/16 1700)   PRN Meds:.sodium chloride, acetaminophen, ondansetron (ZOFRAN) IV, oxyCODONE, sodium chloride flush  Assessment/Plan: Acute on chronic systolic left heart failure Atrial fibrillation with controlled ventricular response Hypertension Dilated cardiomyopathy Hypokalemia  Potassium replacement. Add spironolactone, Cardizem, hydralazine and losartan and adjust doses as needed and tolerated. Wean off IV NTG when possible. Decrease lasix dose from AM. Consider R+ L heart cath in 2-3 days.   LOS: 1 day    Orpah Cobb  MD  03/28/2016, 9:22  PM

## 2016-03-29 ENCOUNTER — Inpatient Hospital Stay (HOSPITAL_COMMUNITY): Payer: Medicaid - Out of State

## 2016-03-29 LAB — BASIC METABOLIC PANEL
Anion gap: 10 (ref 5–15)
Anion gap: 9 (ref 5–15)
BUN: 12 mg/dL (ref 6–20)
BUN: 12 mg/dL (ref 6–20)
CHLORIDE: 104 mmol/L (ref 101–111)
CHLORIDE: 106 mmol/L (ref 101–111)
CO2: 25 mmol/L (ref 22–32)
CO2: 22 mmol/L (ref 22–32)
Calcium: 9.1 mg/dL (ref 8.9–10.3)
Calcium: 9.1 mg/dL (ref 8.9–10.3)
Creatinine, Ser: 1.09 mg/dL (ref 0.61–1.24)
Creatinine, Ser: 1.22 mg/dL (ref 0.61–1.24)
GFR calc Af Amer: >60 mL/min (ref >60)
GFR calc non Af Amer: >60 mL/min (ref >60)
GFR calc non Af Amer: >60 mL/min (ref >60)
GLUCOSE: 131 mg/dL — AB (ref 65–99)
Glucose, Bld: 147 mg/dL — ABNORMAL HIGH (ref 65–99)
POTASSIUM: 2.8 mmol/L — AB (ref 3.5–5.1)
Potassium: 3.6 mmol/L (ref 3.5–5.1)
SODIUM: 139 mmol/L (ref 135–145)
Sodium: 137 mmol/L (ref 135–145)

## 2016-03-29 LAB — HEPARIN LEVEL (UNFRACTIONATED): HEPARIN UNFRACTIONATED: 0.43 [IU]/mL (ref 0.30–0.70)

## 2016-03-29 LAB — CBC
HEMATOCRIT: 45.5 % (ref 39.0–52.0)
HEMOGLOBIN: 15.7 g/dL (ref 13.0–17.0)
MCH: 30.5 pg (ref 26.0–34.0)
MCHC: 34.5 g/dL (ref 30.0–36.0)
MCV: 88.3 fL (ref 78.0–100.0)
Platelets: 220 10*3/uL (ref 150–400)
RBC: 5.15 MIL/uL (ref 4.22–5.81)
RDW: 13.4 % (ref 11.5–15.5)
WBC: 7.1 10*3/uL (ref 4.0–10.5)

## 2016-03-29 LAB — MAGNESIUM: Magnesium: 1.8 mg/dL (ref 1.7–2.4)

## 2016-03-29 MED ORDER — POTASSIUM CHLORIDE CRYS ER 20 MEQ PO TBCR
20.0000 meq | EXTENDED_RELEASE_TABLET | Freq: Two times a day (BID) | ORAL | Status: DC
  Administered 2016-03-29 – 2016-04-02 (×8): 20 meq via ORAL
  Filled 2016-03-29 (×4): qty 1
  Filled 2016-03-29: qty 2
  Filled 2016-03-29 (×3): qty 1

## 2016-03-29 MED ORDER — LOSARTAN POTASSIUM 50 MG PO TABS
50.0000 mg | ORAL_TABLET | Freq: Every day | ORAL | Status: DC
  Administered 2016-03-29: 50 mg via ORAL
  Filled 2016-03-29 (×2): qty 1

## 2016-03-29 MED ORDER — POTASSIUM CHLORIDE CRYS ER 20 MEQ PO TBCR
40.0000 meq | EXTENDED_RELEASE_TABLET | ORAL | Status: AC
  Administered 2016-03-29 (×2): 40 meq via ORAL
  Filled 2016-03-29 (×2): qty 2

## 2016-03-29 MED ORDER — METOPROLOL TARTRATE 50 MG PO TABS
50.0000 mg | ORAL_TABLET | Freq: Two times a day (BID) | ORAL | Status: DC
  Administered 2016-03-29 (×2): 50 mg via ORAL
  Filled 2016-03-29 (×3): qty 1

## 2016-03-29 NOTE — Progress Notes (Signed)
Subjective:  Denies any chest pain states breathing has improved. Diuresing well with IV Lasix and low-dose milrinone. Remains in A. fib with moderate ventricular response.  Objective:  Vital Signs in the last 24 hours: Temp:  [97.8 F (36.6 C)-98.2 F (36.8 C)] 98.1 F (36.7 C) (10/07 0900) Pulse Rate:  [62-112] 92 (10/07 1015) Resp:  [17-29] 20 (10/07 1015) BP: (107-186)/(77-129) 129/86 (10/07 1015) SpO2:  [93 %-98 %] 93 % (10/07 1015) Weight:  [253 lb 12 oz (115.1 kg)] 253 lb 12 oz (115.1 kg) (10/07 0524)  Intake/Output from previous day: 10/06 0701 - 10/07 0700 In: 1590.5 [P.O.:820; I.V.:720.5; IV Piggyback:50] Out: 5275 [Urine:5275] Intake/Output from this shift: Total I/O In: -  Out: 700 [Urine:700]  Physical Exam: Neck: no adenopathy, no carotid bruit, no JVD and supple, symmetrical, trachea midline Lungs: Faint bibasilar rales noted Heart: irregularly irregular rhythm, S1, S2 normal and 2/6 systolic murmur and soft S3 gallop noted Abdomen: soft, non-tender; bowel sounds normal; no masses,  no organomegaly Extremities: extremities normal, atraumatic, no cyanosis or edema  Lab Results:  Recent Labs  03/28/16 0231 03/29/16 0219  WBC 5.8 7.1  HGB 14.6 15.7  PLT 176 220    Recent Labs  03/28/16 0231 03/29/16 0219  NA 140 139  K 3.2* 2.8*  CL 105 104  CO2 25 25  GLUCOSE 100* 147*  BUN 16 12  CREATININE 1.35* 1.09    Recent Labs  03/27/16 1954 03/28/16 0231  TROPONINI <0.03 <0.03   Hepatic Function Panel  Recent Labs  03/27/16 1025  PROT 6.8  ALBUMIN 3.7  AST 45*  ALT 50  ALKPHOS 86  BILITOT 1.2  BILIDIR 0.3  IBILI 0.9   No results for input(s): CHOL in the last 72 hours. No results for input(s): PROTIME in the last 72 hours.  Imaging: Imaging results have been reviewed and Ct Angio Chest Pe W Or Wo Contrast  Result Date: 03/27/2016 CLINICAL DATA:  The patient has had a cold for the last week. Shortness of breath for the last week.  History of atrial fibrillation, cardioversion, and hypertension. EXAM: CT ANGIOGRAPHY CHEST WITH CONTRAST TECHNIQUE: Multidetector CT imaging of the chest was performed using the standard protocol during bolus administration of intravenous contrast. Multiplanar CT image reconstructions and MIPs were obtained to evaluate the vascular anatomy. CONTRAST:  80 cc Isovue 370 COMPARISON:  Chest x-ray 03/27/2016 FINDINGS: Cardiovascular: Four-chamber enlargement of the heart. Trace pericardial effusion. Pulmonary arteries are moderately well opacified by contrast bolus. There is no acute pulmonary embolus. Aortic arch has a normal appearance. Mediastinum/Nodes: The visualized portion of the thyroid gland has a normal appearance. Prevascular lymph node is 1.3 cm. Three carinal lymph node is 1.6 cm. Subcarinal lymph node is 1.6 cm. Right diaphragmatic lymph node is 1.2 cm. Confluent soft tissue is identified in the hilar regions, consistent with lymphadenopathy. Lungs/Pleura: Bilateral pleural effusions are present. Patchy infiltrates are identified in the lower lobes bilaterally. Screws septal thickening and dependent changes are also noted, consistent with superimposed pulmonary edema. Within the right upper lobe there is a focal opacity which appears more rounded and masslike, measuring 2.0 x 2.9 cm on image 14 of series 6. Upper Abdomen: Reflux of contrast into the hepatic veins consistent with some component of right heart failure. Otherwise the visualized portion of the upper abdomen is unremarkable in appearance. Musculoskeletal: Mild degenerative changes are seen in the thoracic spine. Review of the MIP images confirms the above findings. IMPRESSION: 1. Technically adequate exam  demonstrate no acute pulmonary embolus. 2. Four-chamber enlargement of the heart. Trace pericardial effusion. 3. Bilateral lower lobe infiltrates, favored to be infectious. 4. Changes of interstitial pulmonary edema and pleural effusions  superimposed on lower lobe infiltrates. 5. 2.9 cm right upper lobe masslike density could be a manifestation of the infectious infiltrates. However this appears more discrete. Close followup is recommended to demonstrate resolution and to exclude a lung mass. 6. Reflux of contrast in the hepatic veins consistent with elevated right heart pressures. Electronically Signed   By: Norva PavlovElizabeth  Brown M.D.   On: 03/27/2016 14:50   Dg Chest Port 1 View  Result Date: 03/29/2016 CLINICAL DATA:  Acute on chronic left-sided systolic heart failure. EXAM: PORTABLE CHEST 1 VIEW COMPARISON:  03/28/2016 FINDINGS: Markedly enlarged cardiac silhouette appears the same. Probable pulmonary venous hypertension but no interstitial mid edema or alveolar edema. No visible effusions. No bony abnormality. IMPRESSION: Persistent enlarged cardiac silhouette. Pulmonary venous hypertension. No definite edema. Electronically Signed   By: Paulina FusiMark  Shogry M.D.   On: 03/29/2016 07:17   Dg Chest Port 1 View  Result Date: 03/28/2016 CLINICAL DATA:  Atrial fibrillation.  Shortness of breath . EXAM: PORTABLE CHEST 1 VIEW COMPARISON:  CT 03/27/2016.  Chest x-ray 03/27/2016. FINDINGS: Cardiomegaly with pulmonary vascular prominence and bilateral interstitial prominence suggesting congestive heart failure. As noted on prior CT bibasilar pneumonia cannot be excluded. Previously identified density in the right upper lung on CT of 03/27/2016 has partially cleared. Costophrenic angles not imaged. No pneumothorax . IMPRESSION: 1. Cardiomegaly with mild bilateral pulmonary interstitial prominence consistent with mild congestive heart failure. 2. As noted on prior recent CT bibasilar pneumonia cannot be excluded. Previously identified right upper lobe density/ infiltrate on CT of 03/27/2016 has partially cleared. Electronically Signed   By: Maisie Fushomas  Register   On: 03/28/2016 07:13    Cardiac Studies:  Assessment/Plan:  Acute on chronic systolic left heart  failure Atrial fibrillation with moderate ventricular response Hypertension Dilated cardiomyopathy Hypokalemia Plan Replace K Increase losartan as per orders DC hydralazine in view of tachycardia Change Cardizem to Lopressor in view of depressed LV systolic function Check labs in a.m.  LOS: 2 days    Rinaldo CloudHarwani, Chequita Mofield 03/29/2016, 10:57 AM

## 2016-03-29 NOTE — Progress Notes (Signed)
ANTICOAGULATION CONSULT NOTE - Follow Up Consult  Pharmacy Consult for heparin Indication: atrial fibrillation  No Known Allergies  Patient Measurements: Height: 6' 3.98" (193 cm) Weight: 253 lb 12 oz (115.1 kg) IBW/kg (Calculated) : 86.76 Heparin Dosing Weight: 112.2 kg  Vital Signs: Temp: 98.2 F (36.8 C) (10/07 0524) Temp Source: Oral (10/07 0524) BP: 169/107 (10/07 0800) Pulse Rate: 95 (10/07 0800)  Labs:  Recent Labs  03/27/16 1025 03/27/16 1548  03/27/16 1954 03/28/16 0231 03/28/16 1454 03/28/16 2147 03/29/16 0219  HGB 16.1  --   --   --  14.6  --   --  15.7  HCT 47.0  --   --   --  43.9  --   --  45.5  PLT 217  --   --   --  176  --   --  220  LABPROT 13.7  --   --   --   --   --   --   --   INR 1.05  --   --   --   --   --   --   --   HEPARINUNFRC  --   --   < > 0.16* 0.24* 0.48 0.40 0.43  CREATININE 1.30*  --   --   --  1.35*  --   --  1.09  TROPONINI  --  <0.03  --  <0.03 <0.03  --   --   --   < > = values in this interval not displayed.  Estimated Creatinine Clearance: 105 mL/min (by C-G formula based on SCr of 1.09 mg/dL).   Medications:  Scheduled:  . azithromycin  500 mg Oral Q24H  . cefTRIAXone (ROCEPHIN)  IV  1 g Intravenous Q24H  . Chlorhexidine Gluconate Cloth  6 each Topical Q0600  . diltiazem  60 mg Oral Q6H  . furosemide  40 mg Intravenous Daily  . hydrALAZINE  50 mg Oral Q8H  . losartan  25 mg Oral Daily  . mouth rinse  15 mL Mouth Rinse BID  . mupirocin ointment  1 application Nasal BID  . potassium chloride  20 mEq Oral BID  . potassium chloride  40 mEq Oral Q3H  . sodium chloride flush  3 mL Intravenous Q12H  . spironolactone  12.5 mg Oral BID   Infusions:  . heparin 2,150 Units/hr (03/29/16 0600)  . milrinone 0.125 mcg/kg/min (03/29/16 0052)  . nitroGLYCERIN 10 mcg/min (03/29/16 8889)    Assessment: 55 yo male with afib is currently on therapeutic heparin. CBC stable, no bleeding or complications noted.    Goal of Therapy:   Heparin level 0.3-0.7 units/ml Monitor platelets by anticoagulation protocol: Yes   Plan:  -Continue IV heparin at current rate. -Daily heparin level and CBC. -Plans for oral anticoagulation?  Tad Moore, BCPS  Clinical Pharmacist Pager [phone]  03/29/2016 9:04 AM

## 2016-03-30 LAB — BASIC METABOLIC PANEL
ANION GAP: 11 (ref 5–15)
BUN: 13 mg/dL (ref 6–20)
CALCIUM: 9.3 mg/dL (ref 8.9–10.3)
CO2: 25 mmol/L (ref 22–32)
Chloride: 104 mmol/L (ref 101–111)
Creatinine, Ser: 1.29 mg/dL — ABNORMAL HIGH (ref 0.61–1.24)
GFR calc Af Amer: >60 mL/min (ref >60)
GFR calc non Af Amer: >60 mL/min (ref >60)
GLUCOSE: 94 mg/dL (ref 65–99)
POTASSIUM: 3.6 mmol/L (ref 3.5–5.1)
Sodium: 140 mmol/L (ref 135–145)

## 2016-03-30 LAB — BRAIN NATRIURETIC PEPTIDE: B Natriuretic Peptide: 174.4 pg/mL — ABNORMAL HIGH (ref 0.0–100.0)

## 2016-03-30 LAB — CBC
HCT: 47.4 % (ref 39.0–52.0)
HEMOGLOBIN: 16 g/dL (ref 13.0–17.0)
MCH: 30.4 pg (ref 26.0–34.0)
MCHC: 33.8 g/dL (ref 30.0–36.0)
MCV: 90.1 fL (ref 78.0–100.0)
Platelets: 210 10*3/uL (ref 150–400)
RBC: 5.26 MIL/uL (ref 4.22–5.81)
RDW: 14.1 % (ref 11.5–15.5)
WBC: 5.9 10*3/uL (ref 4.0–10.5)

## 2016-03-30 LAB — HEPARIN LEVEL (UNFRACTIONATED): Heparin Unfractionated: 0.54 IU/mL (ref 0.30–0.70)

## 2016-03-30 LAB — MAGNESIUM: Magnesium: 1.9 mg/dL (ref 1.7–2.4)

## 2016-03-30 MED ORDER — METOPROLOL TARTRATE 50 MG PO TABS
50.0000 mg | ORAL_TABLET | Freq: Three times a day (TID) | ORAL | Status: DC
  Administered 2016-03-30 – 2016-03-31 (×4): 50 mg via ORAL
  Filled 2016-03-30 (×3): qty 1

## 2016-03-30 MED ORDER — LABETALOL HCL 5 MG/ML IV SOLN
10.0000 mg | Freq: Four times a day (QID) | INTRAVENOUS | Status: DC | PRN
  Administered 2016-03-30 – 2016-03-31 (×3): 10 mg via INTRAVENOUS
  Filled 2016-03-30 (×4): qty 4

## 2016-03-30 MED ORDER — LOSARTAN POTASSIUM 50 MG PO TABS
100.0000 mg | ORAL_TABLET | Freq: Every day | ORAL | Status: DC
Start: 2016-03-30 — End: 2016-04-02
  Administered 2016-03-30 – 2016-04-02 (×4): 100 mg via ORAL
  Filled 2016-03-30 (×4): qty 2

## 2016-03-30 MED ORDER — ISOSORB DINITRATE-HYDRALAZINE 20-37.5 MG PO TABS
1.0000 | ORAL_TABLET | Freq: Three times a day (TID) | ORAL | Status: DC
  Administered 2016-03-30 – 2016-04-02 (×9): 1 via ORAL
  Filled 2016-03-30 (×9): qty 1

## 2016-03-30 MED ORDER — POTASSIUM CHLORIDE CRYS ER 20 MEQ PO TBCR: 40.0000 meq | EXTENDED_RELEASE_TABLET | Freq: Once | ORAL | Status: DC

## 2016-03-30 NOTE — Progress Notes (Addendum)
Nitroglycerin gtt restarted per order after BP creeping up tonight to 167/135. Nitro started out at [facility] and has been titrated up once to 10 mcq. Will continue to monitor and notifly MD if needed.

## 2016-03-30 NOTE — Progress Notes (Signed)
Subjective:  Denies any chest pain or shortness of breath.  Objective:  Vital Signs in the last 24 hours: Temp:  [98.4 F (36.9 C)-98.7 F (37.1 C)] 98.5 F (36.9 C) (10/08 0800) Pulse Rate:  [47-131] 107 (10/08 0800) Resp:  [15-29] 19 (10/08 0800) BP: (107-167)/(78-145) 166/115 (10/08 0800) SpO2:  [92 %-100 %] 94 % (10/08 0800) Weight:  [257 lb 11.2 oz (116.9 kg)] 257 lb 11.2 oz (116.9 kg) (10/08 0500)  Intake/Output from previous day: 10/07 0701 - 10/08 0700 In: 983.7 [P.O.:320; I.V.:613.7; IV Piggyback:50] Out: 1525 [Urine:1525] Intake/Output from this shift: Total I/O In: 53.8 [I.V.:53.8] Out: -   Physical Exam: Neck: no adenopathy, no carotid bruit, no JVD and supple, symmetrical, trachea midline Lungs: Decrease breath sound at bases Heart: irregularly irregular rhythm, S1, S2 normal and 2/6 systolic murmur noted Abdomen: soft, non-tender; bowel sounds normal; no masses,  no organomegaly Extremities: extremities normal, atraumatic, no cyanosis or edema  Lab Results:  Recent Labs  03/29/16 0219 03/30/16 0434  WBC 7.1 5.9  HGB 15.7 16.0  PLT 220 210    Recent Labs  03/29/16 1417 03/30/16 0434  NA 137 140  K 3.6 3.6  CL 106 104  CO2 22 25  GLUCOSE 131* 94  BUN 12 13  CREATININE 1.22 1.29*    Recent Labs  03/27/16 1954 03/28/16 0231  TROPONINI <0.03 <0.03   Hepatic Function Panel  Recent Labs  03/27/16 1025  PROT 6.8  ALBUMIN 3.7  AST 45*  ALT 50  ALKPHOS 86  BILITOT 1.2  BILIDIR 0.3  IBILI 0.9   No results for input(s): CHOL in the last 72 hours. No results for input(s): PROTIME in the last 72 hours.  Imaging: Imaging results have been reviewed and Dg Chest Port 1 View  Result Date: 03/29/2016 CLINICAL DATA:  Acute on chronic left-sided systolic heart failure. EXAM: PORTABLE CHEST 1 VIEW COMPARISON:  03/28/2016 FINDINGS: Markedly enlarged cardiac silhouette appears the same. Probable pulmonary venous hypertension but no interstitial  mid edema or alveolar edema. No visible effusions. No bony abnormality. IMPRESSION: Persistent enlarged cardiac silhouette. Pulmonary venous hypertension. No definite edema. Electronically Signed   By: Paulina Fusi M.D.   On: 03/29/2016 07:17    Cardiac Studies:  Assessment/Plan:  Resolving Acute on chronic systolic left heart failure Atrial fibrillation with moderate ventricular response Hypertension Dilated cardiomyopathy Status post Hypokalemia Plan Increase Lopressor to 50 mg 3 times daily Increase losartan to 100 mg daily Check labs in a.m. Wean off nitro drip  LOS: 3 days    Dwayne Ramirez 03/30/2016, 9:05 AM

## 2016-03-30 NOTE — Progress Notes (Signed)
ANTICOAGULATION CONSULT NOTE - Follow Up Consult  Pharmacy Consult for heparin Indication: atrial fibrillation  No Known Allergies  Patient Measurements: Height: 6' 3.98" (193 cm) Weight: 257 lb 11.2 oz (116.9 kg) IBW/kg (Calculated) : 86.76 Heparin Dosing Weight: 112.2 kg  Vital Signs: Temp: 98.4 F (36.9 C) (10/08 0400) Temp Source: Oral (10/08 0400) BP: 159/125 (10/08 0600) Pulse Rate: 93 (10/08 0600)  Labs:  Recent Labs  03/27/16 1025 03/27/16 1548  03/27/16 1954 03/28/16 0231  03/28/16 2147 03/29/16 0219 03/29/16 1417 03/30/16 0434  HGB 16.1  --   --   --  14.6  --   --  15.7  --  16.0  HCT 47.0  --   --   --  43.9  --   --  45.5  --  47.4  PLT 217  --   --   --  176  --   --  220  --  210  LABPROT 13.7  --   --   --   --   --   --   --   --   --   INR 1.05  --   --   --   --   --   --   --   --   --   HEPARINUNFRC  --   --   < > 0.16* 0.24*  < > 0.40 0.43  --  0.54  CREATININE 1.30*  --   --   --  1.35*  --   --  1.09 1.22 1.29*  TROPONINI  --  <0.03  --  <0.03 <0.03  --   --   --   --   --   < > = values in this interval not displayed.  Estimated Creatinine Clearance: 89.4 mL/min (by C-G formula based on SCr of 1.29 mg/dL (H)).   Medications:  Scheduled:  . cefTRIAXone (ROCEPHIN)  IV  1 g Intravenous Q24H  . Chlorhexidine Gluconate Cloth  6 each Topical Q0600  . furosemide  40 mg Intravenous Daily  . losartan  50 mg Oral Daily  . mouth rinse  15 mL Mouth Rinse BID  . metoprolol tartrate  50 mg Oral BID  . mupirocin ointment  1 application Nasal BID  . potassium chloride  20 mEq Oral BID  . sodium chloride flush  3 mL Intravenous Q12H  . spironolactone  12.5 mg Oral BID   Infusions:  . heparin 2,150 Units/hr (03/29/16 1900)  . milrinone 0.125 mcg/kg/min (03/29/16 2316)  . nitroGLYCERIN 3 mcg/min (03/30/16 0245)    Assessment: 55 yo male with afib is currently on therapeutic heparin. CBC stable, no bleeding or complications noted.    Goal of  Therapy:  Heparin level 0.3-0.7 units/ml Monitor platelets by anticoagulation protocol: Yes   Plan:  -Continue IV heparin at current rate. -Daily heparin level and CBC. -Plans for oral anticoagulation?  Tad Moore, BCPS  Clinical Pharmacist Pager [phone]  03/30/2016 7:44 AM

## 2016-03-31 LAB — BASIC METABOLIC PANEL
Anion gap: 9 (ref 5–15)
BUN: 13 mg/dL (ref 6–20)
CHLORIDE: 104 mmol/L (ref 101–111)
CO2: 24 mmol/L (ref 22–32)
CREATININE: 1.12 mg/dL (ref 0.61–1.24)
Calcium: 9 mg/dL (ref 8.9–10.3)
GFR calc Af Amer: >60 mL/min (ref >60)
GFR calc non Af Amer: >60 mL/min (ref >60)
GLUCOSE: 98 mg/dL (ref 65–99)
POTASSIUM: 3.4 mmol/L — AB (ref 3.5–5.1)
SODIUM: 137 mmol/L (ref 135–145)

## 2016-03-31 LAB — CBC
HEMATOCRIT: 46.3 % (ref 39.0–52.0)
HEMOGLOBIN: 15.1 g/dL (ref 13.0–17.0)
MCH: 29.3 pg (ref 26.0–34.0)
MCHC: 32.6 g/dL (ref 30.0–36.0)
MCV: 89.9 fL (ref 78.0–100.0)
Platelets: 197 10*3/uL (ref 150–400)
RBC: 5.15 MIL/uL (ref 4.22–5.81)
RDW: 13.8 % (ref 11.5–15.5)
WBC: 6 10*3/uL (ref 4.0–10.5)

## 2016-03-31 LAB — HEPARIN LEVEL (UNFRACTIONATED): HEPARIN UNFRACTIONATED: 0.56 [IU]/mL (ref 0.30–0.70)

## 2016-03-31 MED ORDER — CLONIDINE HCL 0.1 MG PO TABS
0.1000 mg | ORAL_TABLET | Freq: Two times a day (BID) | ORAL | Status: DC
  Administered 2016-03-31 – 2016-04-01 (×3): 0.1 mg via ORAL
  Filled 2016-03-31 (×3): qty 1

## 2016-03-31 MED ORDER — METOPROLOL TARTRATE 25 MG PO TABS
25.0000 mg | ORAL_TABLET | Freq: Three times a day (TID) | ORAL | Status: DC
  Administered 2016-03-31 – 2016-04-02 (×6): 25 mg via ORAL
  Filled 2016-03-31 (×6): qty 1

## 2016-03-31 MED ORDER — DILTIAZEM HCL 30 MG PO TABS
30.0000 mg | ORAL_TABLET | Freq: Four times a day (QID) | ORAL | Status: DC
  Administered 2016-03-31 – 2016-04-01 (×6): 30 mg via ORAL
  Filled 2016-03-31 (×6): qty 1

## 2016-03-31 MED ORDER — RIVAROXABAN 20 MG PO TABS: 20.0000 mg | ORAL_TABLET | Freq: Every day | ORAL | Status: DC

## 2016-03-31 MED ORDER — RIVAROXABAN 20 MG PO TABS
20.0000 mg | ORAL_TABLET | Freq: Every day | ORAL | Status: DC
  Administered 2016-03-31 – 2016-04-01 (×2): 20 mg via ORAL
  Filled 2016-03-31 (×2): qty 1

## 2016-03-31 NOTE — Progress Notes (Signed)
ANTICOAGULATION CONSULT NOTE - Follow Up Consult  Pharmacy Consult for heparin Indication: atrial fibrillation  No Known Allergies  Patient Measurements: Height: 6' 3.98" (193 cm) Weight: 262 lb (118.8 kg) IBW/kg (Calculated) : 86.76 Heparin Dosing Weight: 112.2 kg  Vital Signs: Temp: 98.9 F (37.2 C) (10/09 0700) Temp Source: Oral (10/09 0700) BP: 154/102 (10/09 0700) Pulse Rate: 96 (10/09 0700)  Labs:  Recent Labs  03/29/16 0219 03/29/16 1417 03/30/16 0434 03/31/16 0314  HGB 15.7  --  16.0 15.1  HCT 45.5  --  47.4 46.3  PLT 220  --  210 197  HEPARINUNFRC 0.43  --  0.54 0.56  CREATININE 1.09 1.22 1.29* 1.12    Estimated Creatinine Clearance: 103.8 mL/min (by C-G formula based on SCr of 1.12 mg/dL).   Medications:  Scheduled:  . cefTRIAXone (ROCEPHIN)  IV  1 g Intravenous Q24H  . Chlorhexidine Gluconate Cloth  6 each Topical Q0600  . furosemide  40 mg Intravenous Daily  . isosorbide-hydrALAZINE  1 tablet Oral TID  . losartan  100 mg Oral Daily  . mouth rinse  15 mL Mouth Rinse BID  . metoprolol tartrate  50 mg Oral TID  . mupirocin ointment  1 application Nasal BID  . potassium chloride  20 mEq Oral BID  . sodium chloride flush  3 mL Intravenous Q12H  . spironolactone  12.5 mg Oral BID   Infusions:  . heparin 2,150 Units/hr (03/31/16 3646)  . milrinone 0.125 mcg/kg/min (03/30/16 2134)  . nitroGLYCERIN 10 mcg/min (03/30/16 1830)    Assessment: 55 yo male with afib continuing on therapeutic heparin (previously on warfarin PTA for afib but stopped taking, INR 1.05 on admit). CBC stable, no bleeding or complications noted.    Goal of Therapy:  Heparin level 0.3-0.7 units/ml Monitor platelets by anticoagulation protocol: Yes   Plan:  -Continue IV heparin at current rate -Daily heparin level and CBC -Monitor for s/sx bleeding -F/u plans for oral anticoagulation?  Babs Bertin, PharmD, BCPS Clinical Pharmacist 03/31/2016 8:24 AM

## 2016-03-31 NOTE — Discharge Instructions (Signed)

## 2016-03-31 NOTE — Progress Notes (Signed)
Ref: Pcp Not In System   Subjective:  Feeling better. Lost 5 pounds of weight. Heart rate 70 to 110/min and blood pressure 156/104. Monitor A. Fibrillation.  Objective:  Vital Signs in the last 24 hours: Temp:  [97.5 F (36.4 C)-98.9 F (37.2 C)] 98.5 F (36.9 C) (10/09 1100) Pulse Rate:  [51-110] 71 (10/09 1100) Cardiac Rhythm: Atrial fibrillation (10/09 0800) Resp:  [14-26] 18 (10/09 1100) BP: (135-188)/(80-167) 156/104 (10/09 1400) SpO2:  [89 %-98 %] 98 % (10/09 1200) Weight:  [118.8 kg (262 lb)] 118.8 kg (262 lb) (10/09 0555)  Physical Exam: BP Readings from Last 1 Encounters:  03/31/16 (!) 156/104    Wt Readings from Last 1 Encounters:  03/31/16 118.8 kg (262 lb)    Weight change: 1.95 kg (4 lb 4.8 oz) Body mass index is 31.91 kg/m. HEENT: [place]/AT, Eyes-Brown, PERL, EOMI, Conjunctiva-Pink, Sclera-Non-icteric Neck: No JVD, No bruit, Trachea midline. Lungs:  Clear, Bilateral. Cardiac:  Irregular rhythm, normal S1 and S2, no S3. III/VI systolic murmur. Abdomen:  Soft, non-tender. Extremities:  No edema present. No cyanosis. No clubbing. CNS: AxOx3, Cranial nerves grossly intact, moves all 4 extremities. Right handed. Skin: Warm and dry.   Intake/Output from previous day: 10/08 0701 - 10/09 0700 In: 1477.6 [P.O.:720; I.V.:707.6; IV Piggyback:50] Out: 1675 [Urine:1675]    Lab Results: BMET    Component Value Date/Time   NA 137 03/31/2016 0314   NA 140 03/30/2016 0434   NA 137 03/29/2016 1417   K 3.4 (L) 03/31/2016 0314   K 3.6 03/30/2016 0434   K 3.6 03/29/2016 1417   CL 104 03/31/2016 0314   CL 104 03/30/2016 0434   CL 106 03/29/2016 1417   CO2 24 03/31/2016 0314   CO2 25 03/30/2016 0434   CO2 22 03/29/2016 1417   GLUCOSE 98 03/31/2016 0314   GLUCOSE 94 03/30/2016 0434   GLUCOSE 131 (H) 03/29/2016 1417   BUN 13 03/31/2016 0314   BUN 13 03/30/2016 0434   BUN 12 03/29/2016 1417   CREATININE 1.12 03/31/2016 0314   CREATININE 1.29 (H) 03/30/2016 0434    CREATININE 1.22 03/29/2016 1417   CALCIUM 9.0 03/31/2016 0314   CALCIUM 9.3 03/30/2016 0434   CALCIUM 9.1 03/29/2016 1417   GFRNONAA >60 03/31/2016 0314   GFRNONAA >60 03/30/2016 0434   GFRNONAA >60 03/29/2016 1417   GFRAA >60 03/31/2016 0314   GFRAA >60 03/30/2016 0434   GFRAA >60 03/29/2016 1417   CBC    Component Value Date/Time   WBC 6.0 03/31/2016 0314   RBC 5.15 03/31/2016 0314   HGB 15.1 03/31/2016 0314   HCT 46.3 03/31/2016 0314   PLT 197 03/31/2016 0314   MCV 89.9 03/31/2016 0314   MCH 29.3 03/31/2016 0314   MCHC 32.6 03/31/2016 0314   RDW 13.8 03/31/2016 0314   HEPATIC Function Panel  Recent Labs  03/27/16 1025  PROT 6.8   HEMOGLOBIN A1C No components found for: HGA1C,  MPG CARDIAC ENZYMES Lab Results  Component Value Date   TROPONINI <0.03 03/28/2016   TROPONINI <0.03 03/27/2016   TROPONINI <0.03 03/27/2016   BNP No results for input(s): PROBNP in the last 8760 hours. TSH No results for input(s): TSH in the last 8760 hours. CHOLESTEROL No results for input(s): CHOL in the last 8760 hours.  Scheduled Meds: . cefTRIAXone (ROCEPHIN)  IV  1 g Intravenous Q24H  . Chlorhexidine Gluconate Cloth  6 each Topical Q0600  . cloNIDine  0.1 mg Oral BID  . diltiazem  30 mg Oral Q6H  . furosemide  40 mg Intravenous Daily  . isosorbide-hydrALAZINE  1 tablet Oral TID  . losartan  100 mg Oral Daily  . mouth rinse  15 mL Mouth Rinse BID  . metoprolol tartrate  25 mg Oral TID  . mupirocin ointment  1 application Nasal BID  . potassium chloride  20 mEq Oral BID  . rivaroxaban  20 mg Oral Daily  . sodium chloride flush  3 mL Intravenous Q12H  . 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 Decrease B-blocker due to prior intolerance v/s dislike. Add small dose clonidine and small dose diltiazem to decrease blood pressure further and wean off IV NTG and DC milrinone. Increase activity. Check cardiac cath report from [place].   LOS: 4 days    Orpah Cobb  MD  03/31/2016, 2:12 PM

## 2016-04-01 MED ORDER — DILTIAZEM HCL 60 MG PO TABS
60.0000 mg | ORAL_TABLET | Freq: Four times a day (QID) | ORAL | Status: DC
  Administered 2016-04-01 – 2016-04-02 (×2): 60 mg via ORAL
  Filled 2016-04-01 (×3): qty 1

## 2016-04-01 MED ORDER — FUROSEMIDE 40 MG PO TABS
40.0000 mg | ORAL_TABLET | Freq: Every day | ORAL | Status: DC
  Administered 2016-04-01 – 2016-04-02 (×2): 40 mg via ORAL
  Filled 2016-04-01 (×2): qty 1

## 2016-04-01 MED ORDER — LEVOFLOXACIN 500 MG PO TABS
500.0000 mg | ORAL_TABLET | Freq: Every day | ORAL | Status: DC
  Administered 2016-04-01 – 2016-04-02 (×2): 500 mg via ORAL
  Filled 2016-04-01 (×2): qty 1

## 2016-04-01 MED ORDER — CLONIDINE HCL 0.2 MG PO TABS
0.2000 mg | ORAL_TABLET | Freq: Two times a day (BID) | ORAL | Status: DC
  Administered 2016-04-01 – 2016-04-02 (×2): 0.2 mg via ORAL
  Filled 2016-04-01 (×2): qty 1

## 2016-04-01 NOTE — Progress Notes (Signed)
Ref: Pcp Not In System   Subjective:  Feeling better. Lost 10 pounds of weight sincve admission. Afebrile. A. Fibrillation continues. On Xarelto. Records from cardiac cath in [place] showed left dominant system without coronary vessel narrowing.   Objective:  Vital Signs in the last 24 hours: Temp:  [97.7 F (36.5 C)-98.8 F (37.1 C)] 98.2 F (36.8 C) (10/10 1137) Pulse Rate:  [45-94] 91 (10/10 1137) Cardiac Rhythm: Atrial fibrillation (10/10 1600) Resp:  [18-20] 18 (10/10 1137) BP: (128-159)/(96-120) 142/108 (10/10 1728) SpO2:  [91 %-98 %] 95 % (10/10 1137) Weight:  [115.8 kg (255 lb 4.7 oz)] 115.8 kg (255 lb 4.7 oz) (10/10 0700)  Physical Exam: BP Readings from Last 1 Encounters:  04/01/16 (!) 142/108    Wt Readings from Last 1 Encounters:  04/01/16 115.8 kg (255 lb 4.7 oz)    Weight change: -3.043 kg (-6 lb 11.3 oz) Body mass index is 31.09 kg/m. HEENT: [place]/AT, Eyes-Brown, PERL, EOMI, Conjunctiva-Pink, Sclera-Non-icteric Neck: No JVD, No bruit, Trachea midline. Lungs:  Clear, Bilateral. Cardiac:  Irregular rhythm, normal S1 and S2, no S3. III/VI systolic murmur. Abdomen:  Soft, non-tender. Extremities:  No edema present. No cyanosis. No clubbing. CNS: AxOx3, Cranial nerves grossly intact, moves all 4 extremities. Right handed. Skin: Warm and dry.   Intake/Output from previous day: 10/09 0701 - 10/10 0700 In: 1643.5 [P.O.:1380; I.V.:213.5; IV Piggyback:50] Out: 3425 [Urine:3425]    Lab Results: BMET    Component Value Date/Time   NA 137 03/31/2016 0314   NA 140 03/30/2016 0434   NA 137 03/29/2016 1417   K 3.4 (L) 03/31/2016 0314   K 3.6 03/30/2016 0434   K 3.6 03/29/2016 1417   CL 104 03/31/2016 0314   CL 104 03/30/2016 0434   CL 106 03/29/2016 1417   CO2 24 03/31/2016 0314   CO2 25 03/30/2016 0434   CO2 22 03/29/2016 1417   GLUCOSE 98 03/31/2016 0314   GLUCOSE 94 03/30/2016 0434   GLUCOSE 131 (H) 03/29/2016 1417   BUN 13 03/31/2016 0314   BUN 13  03/30/2016 0434   BUN 12 03/29/2016 1417   CREATININE 1.12 03/31/2016 0314   CREATININE 1.29 (H) 03/30/2016 0434   CREATININE 1.22 03/29/2016 1417   CALCIUM 9.0 03/31/2016 0314   CALCIUM 9.3 03/30/2016 0434   CALCIUM 9.1 03/29/2016 1417   GFRNONAA >60 03/31/2016 0314   GFRNONAA >60 03/30/2016 0434   GFRNONAA >60 03/29/2016 1417   GFRAA >60 03/31/2016 0314   GFRAA >60 03/30/2016 0434   GFRAA >60 03/29/2016 1417   CBC    Component Value Date/Time   WBC 6.0 03/31/2016 0314   RBC 5.15 03/31/2016 0314   HGB 15.1 03/31/2016 0314   HCT 46.3 03/31/2016 0314   PLT 197 03/31/2016 0314   MCV 89.9 03/31/2016 0314   MCH 29.3 03/31/2016 0314   MCHC 32.6 03/31/2016 0314   RDW 13.8 03/31/2016 0314   HEPATIC Function Panel  Recent Labs  03/27/16 1025  PROT 6.8   HEMOGLOBIN A1C No components found for: HGA1C,  MPG CARDIAC ENZYMES Lab Results  Component Value Date   TROPONINI <0.03 03/28/2016   TROPONINI <0.03 03/27/2016   TROPONINI <0.03 03/27/2016   BNP No results for input(s): PROBNP in the last 8760 hours. TSH No results for input(s): TSH in the last 8760 hours. CHOLESTEROL No results for input(s): CHOL in the last 8760 hours.  Scheduled Meds: . cloNIDine  0.2 mg Oral BID  . diltiazem  60 mg Oral Q6H  .  furosemide  40 mg Oral Daily  . isosorbide-hydrALAZINE  1 tablet Oral TID  . losartan  100 mg Oral Daily  . mouth rinse  15 mL Mouth Rinse BID  . metoprolol tartrate  25 mg Oral TID  . potassium chloride  20 mEq Oral BID  . rivaroxaban  20 mg Oral Q supper  . sodium chloride flush  3 mL Intravenous Q12H  . spironolactone  12.5 mg Oral BID   Continuous Infusions:  PRN Meds:.sodium chloride, acetaminophen, ondansetron (ZOFRAN) IV, oxyCODONE, sodium chloride flush  Assessment/Plan: Acute on chronic systolic heart failure Possible Pneumonia Atrial fibrillation, persistent with CHA2DS2VASc score of 2 Hypertension Dilated non-ischemic  cardiomyopathy Obesity  Increase dose of antihypertensive. Change to oral antibiotic. Transfer to telebed. Home in AM if stable. [place] [facility]   LOS: 5 days    Orpah CobbAjay Otilio Groleau  MD  04/01/2016, 6:43 PM

## 2016-04-01 NOTE — Progress Notes (Signed)
Pt. tx to 2W15 from 2H. Family at the bedside. Patient Alert and oriented times 4. Patient oriented to unit and room. Call bell within reach.   Valinda Hoar RN

## 2016-04-01 NOTE — Care Management Note (Addendum)
Case Management Note  Patient Details  Name: Dwayne Ramirez MRN: [id number] Date of Birth: [birth year]  Subjective/Objective:     CHF               Action/Plan: Discharge Planning:  NCM spoke to pt and wife at bedside. Pt states he currenlty lives in [place]. He has not switched his Medicaid from [place] to [facility]. Provided pt with Xarelto 30 day free trial card. Pt states he will be able to pay for his medications out of pocket. He does not have a PCP. Will arrange appt at [facility] prior to dc. Will check on out of pocket cost for his meds.   Referral to financial counselor for assistance with [place] Medicaid and disability.   Expected Discharge Date:                  Expected Discharge Plan:  Home/Self Care  In-House Referral:  Financial Counselor  Discharge planning Services  CM Consult, Medication Assistance  Post Acute Care Choice:  NA Choice offered to:  NA  DME Arranged:  N/A DME Agency:  NA  HH Arranged:  NA HH Agency:  NA  Status of Service:  In process, will continue to follow  If discussed at Long Length of Stay Meetings, dates discussed:    Additional Comments:  Elliot Cousin, RN 04/01/2016, 5:35 PM

## 2016-04-02 LAB — LIPID PANEL
Cholesterol: 124 mg/dL (ref 0–200)
HDL: 33 mg/dL — AB (ref >40)
LDL CALC: 78 mg/dL (ref 0–99)
TRIGLYCERIDES: 63 mg/dL (ref <150)
Total CHOL/HDL Ratio: 3.8 RATIO
VLDL: 13 mg/dL (ref 0–40)

## 2016-04-02 LAB — BASIC METABOLIC PANEL
ANION GAP: 10 (ref 5–15)
BUN: 16 mg/dL (ref 6–20)
CALCIUM: 9.3 mg/dL (ref 8.9–10.3)
CHLORIDE: 102 mmol/L (ref 101–111)
CO2: 24 mmol/L (ref 22–32)
Creatinine, Ser: 1.28 mg/dL — ABNORMAL HIGH (ref 0.61–1.24)
GFR calc non Af Amer: >60 mL/min (ref >60)
GLUCOSE: 103 mg/dL — AB (ref 65–99)
Potassium: 3.6 mmol/L (ref 3.5–5.1)
Sodium: 136 mmol/L (ref 135–145)

## 2016-04-02 MED ORDER — LEVOFLOXACIN 500 MG PO TABS: 500.0000 mg | ORAL_TABLET | Freq: Every day | ORAL | 0 refills | Status: DC

## 2016-04-02 MED ORDER — ISOSORB DINITRATE-HYDRALAZINE 20-37.5 MG PO TABS: 1.0000 | ORAL_TABLET | Freq: Three times a day (TID) | ORAL | 6 refills | Status: DC

## 2016-04-02 MED ORDER — METOPROLOL TARTRATE 25 MG PO TABS: 25.0000 mg | ORAL_TABLET | Freq: Two times a day (BID) | ORAL | 3 refills | Status: DC

## 2016-04-02 MED ORDER — CLONIDINE HCL 0.2 MG PO TABS: 0.2000 mg | ORAL_TABLET | Freq: Two times a day (BID) | ORAL | 6 refills | Status: DC

## 2016-04-02 MED ORDER — SPIRONOLACTONE 25 MG PO TABS: 12.5000 mg | ORAL_TABLET | Freq: Two times a day (BID) | ORAL | 6 refills | Status: DC

## 2016-04-02 MED ORDER — FUROSEMIDE 40 MG PO TABS
40.0000 mg | ORAL_TABLET | Freq: Every day | ORAL | 6 refills | Status: DC
Start: 2016-04-02 — End: 2017-07-30

## 2016-04-02 MED ORDER — LOSARTAN POTASSIUM 100 MG PO TABS: 100.0000 mg | ORAL_TABLET | Freq: Every day | ORAL | 6 refills | Status: DC

## 2016-04-02 MED ORDER — DILTIAZEM HCL 60 MG PO TABS: 60.0000 mg | ORAL_TABLET | Freq: Two times a day (BID) | ORAL | 6 refills | Status: DC

## 2016-04-02 MED ORDER — RIVAROXABAN 20 MG PO TABS: 20.0000 mg | ORAL_TABLET | Freq: Every day | ORAL | 6 refills | Status: DC

## 2016-04-02 MED ORDER — POTASSIUM CHLORIDE CRYS ER 20 MEQ PO TBCR: 20.0000 meq | EXTENDED_RELEASE_TABLET | Freq: Two times a day (BID) | ORAL | 6 refills | Status: DC

## 2016-04-02 NOTE — Discharge Summary (Signed)
Physician Discharge Summary  Patient ID: Dwayne Ramirez MRN: [id number] DOB/AGE: 56/24/1962 56 y.o.  Admit date: 03/27/2016 Discharge date: 04/02/2016  Admission Diagnoses: Acute on chronic left heart systolic failure Possible pneumonia Atrial fibrillation with CHADSVASc score of 2 Hypertension Dilated cardiomyopathy Obesity  Discharge Diagnoses:  Principle Problem: * Acute on chronic left systolic heart failure (HCC) * Active Problems:   Atrial fibrillation, persistent with CHA2DS2VASc score of 2   Hypertension   Dilated and non-ischemic cardiomyopathy   Obesity   Possible pneumonia  Discharged Condition: good  Hospital Course: 55 year old male with dilated and non ischemic cardiomyopathy had returned to atrial fibrillation with RVR due to discontinuation of his beta-blocker as he had increasing shortness of breath. His work up showed pulmonary edema and possible pneumonia. He had good diuresis with the help pf lasix and milrinone drip. His blood pressure was controlled with Clonidine, diltiazem, metoprolol and losartan use. He understood need for compliance on diet medications and activity. He will be seen by me in 1 week.  Consults: cardiology  Significant Diagnostic Studies: labs: BMET was near normal with borderline creatinine of 1.30. CBc was normal. Troponin-I was normal. BNP was 174.4  EKG-atrial fibrillation with antero-lateral ischemia and isolated PVCs.  Chest x-ray: cardiomegaly with CHF and possible pneumonia.  CT chest: No pulmonary embolism, Four chamber enlargement of heart, right upper lobe mass like density and elevated right heart pressures.  Echocardiogram: Moderate hypertrophy, mild LV systolic dysfunction, EF 40-45 %, mild pulmonary hypertension, Severe TR and 4 chamber dilatation.  Treatments: cardiac meds: Xarelto, clonidine, Spironolactone, Bidil, losartan, metoprolol, diltiazem, furosemide and potassium.  Discharge Exam: Blood pressure 138/89, pulse  71, temperature 98.2 F (36.8 C), temperature source Oral, resp. rate 18, height 6' 3.98" (1.93 m), weight 115.8 kg (255 lb 4.8 oz), SpO2 97 %. General appearance: alert, cooperative, appears stated age and no distress Head: Normocephalic, atraumatic Eyes: Conjunctivae/corneas clear. PERRL, EOM's intact.  Neck: No adenopathy, no carotid bruit, no JVD, supple, symmetrical, trachea midline and thyroid not enlarged. Resp: Clear to auscultation bilaterally Cardio: Irregular rate and rhythm, S1, S2 normal, no murmur, click, rub or gallop GI: soft, non-tender; bowel sounds normal; no masses,  no organomegaly Extremities: extremities normal, atraumatic, no cyanosis or edema Skin: Warm and dry. No rashes or lesions Neurologic: Alert and oriented X 3, normal strength and tone. Normal coordination and gait  Disposition: 01-Home or Self Care     Medication List    TAKE these medications   cloNIDine 0.2 MG tablet Commonly known as:  CATAPRES Take 1 tablet (0.2 mg total) by mouth 2 (two) times daily.   diltiazem 60 MG tablet Commonly known as:  CARDIZEM Take 1 tablet (60 mg total) by mouth 2 (two) times daily.   furosemide 40 MG tablet Commonly known as:  LASIX Take 1 tablet (40 mg total) by mouth daily.   isosorbide-hydrALAZINE 20-37.5 MG tablet Commonly known as:  BIDIL Take 1 tablet by mouth 3 (three) times daily.   levofloxacin 500 MG tablet Commonly known as:  LEVAQUIN Take 1 tablet (500 mg total) by mouth daily.   losartan 100 MG tablet Commonly known as:  COZAAR Take 1 tablet (100 mg total) by mouth daily.   metoprolol tartrate 25 MG tablet Commonly known as:  LOPRESSOR Take 1 tablet (25 mg total) by mouth 2 (two) times daily.   potassium chloride SA 20 MEQ tablet Commonly known as:  K-DUR,KLOR-CON Take 1 tablet (20 mEq total) by mouth 2 (two) times  daily.   rivaroxaban 20 MG Tabs tablet Commonly known as:  XARELTO Take 1 tablet (20 mg total) by mouth daily with  supper.   spironolactone 25 MG tablet Commonly known as:  ALDACTONE Take 0.5 tablets (12.5 mg total) by mouth 2 (two) times daily.      Follow-up Information    [facility]ADAK[facility],Akya Fiorello S, MD. Schedule an appointment as soon as possible for a visit in 1 week(s).   Specialty:  Cardiology Contact information: [address] [place] [address] [phone]           Signed: Ricki RodriguezKADAKIA,Ovida Delagarza S 04/02/2016, 8:13 AM

## 2016-04-02 NOTE — Progress Notes (Signed)
Patient to D/C home by taxi. Patient education done. Patient no Further questions at this time. IV removed. Tele monitor removed. CCMD notified. Patient ambulated to D/C home by taxi.   Valinda Hoar RN

## 2016-08-28 DIAGNOSIS — E663 Overweight: Secondary | ICD-10-CM | POA: Diagnosis not present

## 2016-08-28 DIAGNOSIS — I10 Essential (primary) hypertension: Secondary | ICD-10-CM | POA: Diagnosis not present

## 2016-08-28 DIAGNOSIS — I482 Chronic atrial fibrillation: Secondary | ICD-10-CM | POA: Diagnosis not present

## 2016-11-28 DIAGNOSIS — I480 Paroxysmal atrial fibrillation: Secondary | ICD-10-CM | POA: Insufficient documentation

## 2016-11-28 DIAGNOSIS — I10 Essential (primary) hypertension: Secondary | ICD-10-CM | POA: Insufficient documentation

## 2016-11-28 DIAGNOSIS — I4891 Unspecified atrial fibrillation: Secondary | ICD-10-CM

## 2016-11-28 DIAGNOSIS — I420 Dilated cardiomyopathy: Secondary | ICD-10-CM

## 2016-12-16 ENCOUNTER — Ambulatory Visit (INDEPENDENT_AMBULATORY_CARE_PROVIDER_SITE_OTHER): Payer: Medicaid Other | Admitting: Family Medicine

## 2016-12-16 ENCOUNTER — Encounter: Payer: Self-pay | Admitting: Family Medicine

## 2016-12-16 VITALS — BP 106/68 | HR 94 | Temp 97.7°F | Ht 74.5 in | Wt 263.0 lb

## 2016-12-16 DIAGNOSIS — I10 Essential (primary) hypertension: Secondary | ICD-10-CM

## 2016-12-16 DIAGNOSIS — I5023 Acute on chronic systolic (congestive) heart failure: Secondary | ICD-10-CM

## 2016-12-16 DIAGNOSIS — I4891 Unspecified atrial fibrillation: Secondary | ICD-10-CM | POA: Diagnosis not present

## 2016-12-16 DIAGNOSIS — Z7689 Persons encountering health services in other specified circumstances: Secondary | ICD-10-CM

## 2016-12-16 NOTE — Assessment & Plan Note (Signed)
Stable on current regimen. Will check CBC at upcoming CPE. Continue to monitor closely for bruising/bleeding issues, palpitations, CP, SOB. Will reschedule with Cardiologist ASAP

## 2016-12-16 NOTE — Assessment & Plan Note (Signed)
Stable without swelling, SOB, CP, orthopnea on current regimen. Continue current medicines and reschedule Cardiology appt ASAP. Will check labs at upcoming CPE

## 2016-12-16 NOTE — Progress Notes (Signed)
BP 106/68   Pulse 94   Temp 97.7 F (36.5 C)   Ht 6' 2.5" (1.892 m)   Wt 263 lb (119.3 kg)   SpO2 96%   BMI 33.32 kg/m    Subjective:    Patient ID: Dwayne Ramirez, male    DOB: July 01, 1960, 56 y.o.   MRN: [id number]  HPI: Dwayne Ramirez is a 56 y.o. male  Chief Complaint  Patient presents with  . Establish Care    no concerns today, just needed to establish with a PCP. He is not certain of his meds, did not bring them with him today.   Patient with hx of HTN, Systolic HF, and A fib presents today to establish care. Was set to see his Cardiologist Dr. Algie Coffer on the 7th for his 6 month f/u but missed the appt. Has not yet rescheduled this appt. No concerns today and not having any side effects or symptoms. Taking his medicines faithfully. States it's been about 1 year since his last CPE to his knowlege.   Did not bring his medicines in today. Believes them to be correct from the list we reviewed but gave [place] the pharmacy name to call and review list for accuracy.   Relevant past medical, surgical, family and social history reviewed and updated as indicated. Interim medical history since our last visit reviewed. Allergies and medications reviewed and updated.  Review of Systems  Constitutional: Negative.   HENT: Negative.   Eyes: Negative.   Respiratory: Negative.   Cardiovascular: Negative.   Gastrointestinal: Negative.   Genitourinary: Negative.   Musculoskeletal: Negative.   Neurological: Negative.   Psychiatric/Behavioral: Negative.    Per HPI unless specifically indicated above     Objective:    BP 106/68   Pulse 94   Temp 97.7 F (36.5 C)   Ht 6' 2.5" (1.892 m)   Wt 263 lb (119.3 kg)   SpO2 96%   BMI 33.32 kg/m   Wt Readings from Last 3 Encounters:  12/16/16 263 lb (119.3 kg)  04/02/16 255 lb 4.8 oz (115.8 kg)  11/16/13 250 lb (113.4 kg)    Physical Exam  Constitutional: He is oriented to person, place, and time. He appears well-developed and  well-nourished. No distress.  HENT:  Head: Atraumatic.  Eyes: Conjunctivae are normal. Pupils are equal, round, and reactive to light. No scleral icterus.  Neck: Normal range of motion. Neck supple.  Cardiovascular: Normal rate and normal heart sounds.   Pulmonary/Chest: Effort normal and breath sounds normal. No respiratory distress.  Musculoskeletal: Normal range of motion. He exhibits no edema or tenderness.  Neurological: He is alert and oriented to person, place, and time.  Skin: Skin is warm and dry.  Psychiatric: He has a normal mood and affect. His behavior is normal.  Nursing note and vitals reviewed.  Results for orders placed or performed in visit on 12/16/16  HM HIV SCREENING LAB  Result Value Ref Range   HM HIV Screening Negative - Patient reported       Assessment & Plan:   Problem List Items Addressed This Visit      Cardiovascular and Mediastinum   Acute on chronic left systolic heart failure (HCC)    Stable without swelling, SOB, CP, orthopnea on current regimen. Continue current medicines and reschedule Cardiology appt ASAP. Will check labs at upcoming CPE      A-fib [facility])    Stable on current regimen. Will check CBC at upcoming CPE. Continue to monitor closely  for bruising/bleeding issues, palpitations, CP, SOB. Will reschedule with Cardiologist ASAP      Hypertension    Stable and WNL on current regimen, restart home BP monitoring 1/week and continue current regimen.        Other Visit Diagnoses    Encounter to establish care    -  Primary   Will schedule CPE in near future as well as bring in his medical records to be scanned.        Follow up plan: Return for CPE.

## 2016-12-16 NOTE — Assessment & Plan Note (Signed)
Stable and WNL on current regimen, restart home BP monitoring 1/week and continue current regimen.

## 2017-01-15 ENCOUNTER — Encounter: Payer: Self-pay | Admitting: Family Medicine

## 2017-01-15 ENCOUNTER — Ambulatory Visit (INDEPENDENT_AMBULATORY_CARE_PROVIDER_SITE_OTHER): Payer: Medicaid Other | Admitting: Family Medicine

## 2017-01-15 VITALS — BP 110/67 | HR 55 | Temp 98.1°F | Ht 74.0 in | Wt 261.0 lb

## 2017-01-15 DIAGNOSIS — Z0000 Encounter for general adult medical examination without abnormal findings: Secondary | ICD-10-CM

## 2017-01-15 DIAGNOSIS — Z1211 Encounter for screening for malignant neoplasm of colon: Secondary | ICD-10-CM | POA: Diagnosis not present

## 2017-01-15 NOTE — Progress Notes (Signed)
BP 110/67   Pulse (!) 55   Temp 98.1 F (36.7 C)   Ht 6\' 2"  (1.88 m)   Wt 261 lb (118.4 kg)   SpO2 97%   BMI 33.51 kg/m    Subjective:    Patient ID: Dwayne Ramirez, male    DOB: 04-29-1961, 56 y.o.   MRN: [id number]  HPI: Dwayne Ramirez is a 56 y.o. male presenting on 01/15/2017 for comprehensive medical examination. Current medical complaints include:none  Depression Screen done today and results listed below:  Depression screen [facility] 2/9 01/15/2017  Decreased Interest 0  Down, Depressed, Hopeless 0  PHQ - 2 Score 0   The patient does not have a history of falls. I did not complete a risk assessment for falls. A plan of care for falls was not documented.   Past Medical History:  Past Medical History:  Diagnosis Date  . A-fib (HCC)   . Acute on chronic left systolic heart failure (HCC) 03/2016  . Dilated cardiomyopathy (HCC)   . Hypertension   . Obesity     Surgical History:  History reviewed. No pertinent surgical history.  Medications:  Current Outpatient Prescriptions on File Prior to Visit  Medication Sig  . cloNIDine (CATAPRES) 0.2 MG tablet Take 1 tablet (0.2 mg total) by mouth 2 (two) times daily.  Marland Kitchen diltiazem (CARDIZEM) 60 MG tablet Take 1 tablet (60 mg total) by mouth 2 (two) times daily.  . furosemide (LASIX) 40 MG tablet Take 1 tablet (40 mg total) by mouth daily.  . isosorbide-hydrALAZINE (BIDIL) 20-37.5 MG tablet Take 1 tablet by mouth 3 (three) times daily.  Marland Kitchen losartan (COZAAR) 100 MG tablet Take 1 tablet (100 mg total) by mouth daily.  . metoprolol tartrate (LOPRESSOR) 25 MG tablet Take 1 tablet (25 mg total) by mouth 2 (two) times daily.  . potassium chloride SA (K-DUR,KLOR-CON) 20 MEQ tablet Take 1 tablet (20 mEq total) by mouth 2 (two) times daily.  . rivaroxaban (XARELTO) 20 MG TABS tablet Take 1 tablet (20 mg total) by mouth daily with supper.  Marland Kitchen spironolactone (ALDACTONE) 25 MG tablet Take 0.5 tablets (12.5 mg total) by mouth 2 (two) times daily.    No current facility-administered medications on file prior to visit.     Allergies:  No Known Allergies  Social History:  Social History   Social History  . Marital status: Single    Spouse name: N/A  . Number of children: N/A  . Years of education: N/A   Occupational History  . Not on file.   Social History Main Topics  . Smoking status: Never Smoker  . Smokeless tobacco: Never Used  . Alcohol use No  . Drug use: No  . Sexual activity: Not on file   Other Topics Concern  . Not on file   Social History Narrative  . No narrative on file   History  Smoking Status  . Never Smoker  Smokeless Tobacco  . Never Used   History  Alcohol Use No    Family History:  Family History  Problem Relation Age of Onset  . Hypertension Mother   . Hypertension Father   . Cancer Neg Hx   . Diabetes Neg Hx   . COPD Neg Hx   . Heart disease Neg Hx   . Stroke Neg Hx     Past medical history, surgical history, medications, allergies, family history and social history reviewed with patient today and changes made to appropriate areas of the chart.  Review of Systems - General ROS: negative Psychological ROS: negative Ophthalmic ROS: negative ENT ROS: negative Respiratory ROS: no cough, shortness of breath, or wheezing Cardiovascular ROS: no chest pain or dyspnea on exertion Gastrointestinal ROS: no abdominal pain, change in bowel habits, or black or bloody stools Genito-Urinary ROS: no dysuria, trouble voiding, or hematuria Musculoskeletal ROS: negative Neurological ROS: no TIA or stroke symptoms Dermatological ROS: negative All other ROS negative except what is listed above and in the HPI.      Objective:    BP 110/67   Pulse (!) 55   Temp 98.1 F (36.7 C)   Ht 6\' 2"  (1.88 m)   Wt 261 lb (118.4 kg)   SpO2 97%   BMI 33.51 kg/m   Wt Readings from Last 3 Encounters:  01/15/17 261 lb (118.4 kg)  12/16/16 263 lb (119.3 kg)  04/02/16 255 lb 4.8 oz (115.8 kg)     Physical Exam  Constitutional: He is oriented to person, place, and time. He appears well-developed and well-nourished. No distress.  HENT:  Head: Atraumatic.  Right Ear: External ear normal.  Left Ear: External ear normal.  Nose: Nose normal.  Mouth/Throat: Oropharynx is clear and moist.  Eyes: Pupils are equal, round, and reactive to light. Conjunctivae are normal. No scleral icterus.  Neck: Normal range of motion. Neck supple.  Cardiovascular: Normal rate, regular rhythm, normal heart sounds and intact distal pulses.   No murmur heard. Pulmonary/Chest: Effort normal and breath sounds normal. No respiratory distress.  Abdominal: Soft. Bowel sounds are normal. He exhibits no distension and no mass. There is no tenderness. There is no guarding.  Genitourinary:  Genitourinary Comments: Declined prostate exam  Musculoskeletal: Normal range of motion. He exhibits no edema or tenderness.  Neurological: He is alert and oriented to person, place, and time. He has normal reflexes.  Skin: Skin is warm and dry. No rash noted.  Psychiatric: He has a normal mood and affect. His behavior is normal.  Nursing note and vitals reviewed.     Assessment & Plan:   Problem List Items Addressed This Visit    None    Visit Diagnoses    Annual physical exam    -  Primary   Fasting labs drawn today. No concerns, doing well   Relevant Orders   CBC with Differential/Platelet   Comprehensive metabolic panel   Lipid Panel w/o Chol/HDL Ratio   TSH   UA/M w/rflx Culture, Routine   HgB A1c   Screen for colon cancer       Relevant Orders   Ambulatory referral to Gastroenterology      Discussed aspirin prophylaxis for myocardial infarction prevention and decision was it was not indicated  LABORATORY TESTING:  Health maintenance labs ordered today as discussed above.   The natural history of prostate cancer and ongoing controversy regarding screening and potential treatment outcomes of prostate  cancer has been discussed with the patient. The meaning of a false positive PSA and a false negative PSA has been discussed. He indicates understanding of the limitations of this screening test and wishes not to proceed with screening PSA testing.   IMMUNIZATIONS:   - Tdap: Tetanus vaccination status reviewed: last tetanus booster within 10 years. - Influenza: Postponed to flu season  SCREENING: - Colonoscopy: Ordered today  Discussed with patient purpose of the colonoscopy is to detect colon cancer at curable precancerous or early stages   PATIENT COUNSELING:    Sexuality: Discussed sexually transmitted diseases, partner selection,  use of condoms, avoidance of unintended pregnancy  and contraceptive alternatives.   Advised to avoid cigarette smoking.  I discussed with the patient that most people either abstain from alcohol or drink within safe limits (<=14/week and <=4 drinks/occasion for males, <=7/weeks and <= 3 drinks/occasion for females) and that the risk for alcohol disorders and other health effects rises proportionally with the number of drinks per week and how often a drinker exceeds daily limits.  Discussed cessation/primary prevention of drug use and availability of treatment for abuse.   Diet: Encouraged to adjust caloric intake to maintain  or achieve ideal body weight, to reduce intake of dietary saturated fat and total fat, to limit sodium intake by avoiding high sodium foods and not adding table salt, and to maintain adequate dietary potassium and calcium preferably from fresh fruits, vegetables, and low-fat dairy products.    stressed the importance of regular exercise  Injury prevention: Discussed safety belts, safety helmets, smoke detector, smoking near bedding or upholstery.   Dental health: Discussed importance of regular tooth brushing, flossing, and dental visits.   Follow up plan: NEXT PREVENTATIVE PHYSICAL DUE IN 1 YEAR. Return in about 6 months (around  07/18/2017) for BP.

## 2017-01-16 LAB — COMPREHENSIVE METABOLIC PANEL
A/G RATIO: 1.5 (ref 1.2–2.2)
ALBUMIN: 4.2 g/dL (ref 3.5–5.5)
ALT: 74 IU/L — ABNORMAL HIGH (ref 0–44)
AST: 67 IU/L — ABNORMAL HIGH (ref 0–40)
Alkaline Phosphatase: 82 IU/L (ref 39–117)
BUN / CREAT RATIO: 11 (ref 9–20)
BUN: 16 mg/dL (ref 6–24)
Bilirubin Total: 0.5 mg/dL (ref 0.0–1.2)
CALCIUM: 9.7 mg/dL (ref 8.7–10.2)
CO2: 19 mmol/L — ABNORMAL LOW (ref 20–29)
CREATININE: 1.45 mg/dL — AB (ref 0.76–1.27)
Chloride: 102 mmol/L (ref 96–106)
GFR, EST AFRICAN AMERICAN: 62 mL/min/{1.73_m2} (ref >59)
GFR, EST NON AFRICAN AMERICAN: 53 mL/min/{1.73_m2} — AB (ref >59)
GLOBULIN, TOTAL: 2.8 g/dL (ref 1.5–4.5)
Glucose: 99 mg/dL (ref 65–99)
POTASSIUM: 5 mmol/L (ref 3.5–5.2)
SODIUM: 139 mmol/L (ref 134–144)
TOTAL PROTEIN: 7 g/dL (ref 6.0–8.5)

## 2017-01-16 LAB — CBC WITH DIFFERENTIAL/PLATELET
Basophils Absolute: 0 10*3/uL (ref 0.0–0.2)
Basos: 1 %
EOS (ABSOLUTE): 0.2 10*3/uL (ref 0.0–0.4)
EOS: 4 %
HEMATOCRIT: 51.3 % — AB (ref 37.5–51.0)
HEMOGLOBIN: 17.2 g/dL (ref 13.0–17.7)
IMMATURE GRANS (ABS): 0 10*3/uL (ref 0.0–0.1)
IMMATURE GRANULOCYTES: 0 %
Lymphocytes Absolute: 2.4 10*3/uL (ref 0.7–3.1)
Lymphs: 49 %
MCH: 30.6 pg (ref 26.6–33.0)
MCHC: 33.5 g/dL (ref 31.5–35.7)
MCV: 91 fL (ref 79–97)
MONOCYTES: 11 %
MONOS ABS: 0.5 10*3/uL (ref 0.1–0.9)
NEUTROS PCT: 35 %
Neutrophils Absolute: 1.7 10*3/uL (ref 1.4–7.0)
Platelets: 232 10*3/uL (ref 150–379)
RBC: 5.63 x10E6/uL (ref 4.14–5.80)
RDW: 15 % (ref 12.3–15.4)
WBC: 4.8 10*3/uL (ref 3.4–10.8)

## 2017-01-16 LAB — HEMOGLOBIN A1C
ESTIMATED AVERAGE GLUCOSE: 128 mg/dL
HEMOGLOBIN A1C: 6.1 % — AB (ref 4.8–5.6)

## 2017-01-16 LAB — TSH: TSH: 2.93 u[IU]/mL (ref 0.450–4.500)

## 2017-01-16 LAB — LIPID PANEL W/O CHOL/HDL RATIO
Cholesterol, Total: 154 mg/dL (ref 100–199)
HDL: 35 mg/dL — ABNORMAL LOW (ref >39)
LDL CALC: 99 mg/dL (ref 0–99)
Triglycerides: 101 mg/dL (ref 0–149)
VLDL Cholesterol Cal: 20 mg/dL (ref 5–40)

## 2017-01-19 ENCOUNTER — Telehealth: Payer: Self-pay | Admitting: Family Medicine

## 2017-01-19 NOTE — Telephone Encounter (Signed)
Left message to call.

## 2017-01-19 NOTE — Telephone Encounter (Signed)
Please call and let him know that his labs came back stable other than - mildly worsened kidney function, stay well hydrated and avoid use of OTC pain relievers like ibuprofen and aleve - elevated liver function tests - try avoiding alcohol, tylenol, and other liver irritants. Maintain healthy diet, avoid saturated fats  - mildly elevated A1C in the prediabetes range, which can be improved with diet and exercise. Work on reducing simple carbohydrates in diet.   We will recheck all of this at 6 month f/u

## 2017-01-19 NOTE — Telephone Encounter (Signed)
Patient notified

## 2017-01-19 NOTE — Telephone Encounter (Signed)
Phone number disconnected

## 2017-01-20 LAB — MICROSCOPIC EXAMINATION
Bacteria, UA: NONE SEEN
RBC MICROSCOPIC, UA: NONE SEEN /HPF (ref 0–?)

## 2017-01-20 LAB — UA/M W/RFLX CULTURE, ROUTINE
BILIRUBIN UA: NEGATIVE
Glucose, UA: NEGATIVE
KETONES UA: NEGATIVE
LEUKOCYTES UA: NEGATIVE
NITRITE UA: NEGATIVE
RBC UA: NEGATIVE
SPEC GRAV UA: 1.025 (ref 1.005–1.030)
Urobilinogen, Ur: 1 mg/dL (ref 0.2–1.0)
pH, UA: 5.5 (ref 5.0–7.5)

## 2017-01-30 ENCOUNTER — Telehealth: Payer: Self-pay

## 2017-01-30 ENCOUNTER — Other Ambulatory Visit: Payer: Self-pay

## 2017-01-30 DIAGNOSIS — Z1211 Encounter for screening for malignant neoplasm of colon: Secondary | ICD-10-CM

## 2017-01-30 NOTE — Telephone Encounter (Signed)
Gastroenterology Pre-Procedure Review  Request Date: 8/21 Requesting Physician: Dr. Allegra Lai  *Patient being treated for A-Fib and CHF. Clearance requested from Dr. Algie Coffer.  PATIENT REVIEW QUESTIONS: The patient responded to the following health history questions as indicated:    1. Are you having any GI issues? no 2. Do you have a personal history of Polyps? no 3. Do you have a family history of Colon Cancer or Polyps? no 4. Diabetes Mellitus? no 5. Joint replacements in the past 12 months?no 6. Major health problems in the past 3 months?yes (A-Fib) 7. Any artificial heart valves, MVP, or defibrillator?CHF    MEDICATIONS & ALLERGIES:    Patient reports the following regarding taking any anticoagulation/antiplatelet therapy:   Plavix, Coumadin, Eliquis, Xarelto, Lovenox, Pradaxa, Brilinta, or Effient? yes (Xarelto by Orpah Cobb) Aspirin? no  Patient confirms/reports the following medications:  Current Outpatient Prescriptions  Medication Sig Dispense Refill  . cloNIDine (CATAPRES) 0.2 MG tablet Take 1 tablet (0.2 mg total) by mouth 2 (two) times daily. 60 tablet 6  . diltiazem (CARDIZEM) 60 MG tablet Take 1 tablet (60 mg total) by mouth 2 (two) times daily. 60 tablet 6  . furosemide (LASIX) 40 MG tablet Take 1 tablet (40 mg total) by mouth daily. 30 tablet 6  . isosorbide-hydrALAZINE (BIDIL) 20-37.5 MG tablet Take 1 tablet by mouth 3 (three) times daily. 90 tablet 6  . losartan (COZAAR) 100 MG tablet Take 1 tablet (100 mg total) by mouth daily. 30 tablet 6  . metoprolol tartrate (LOPRESSOR) 25 MG tablet Take 1 tablet (25 mg total) by mouth 2 (two) times daily. 60 tablet 3  . potassium chloride SA (K-DUR,KLOR-CON) 20 MEQ tablet Take 1 tablet (20 mEq total) by mouth 2 (two) times daily. 60 tablet 6  . rivaroxaban (XARELTO) 20 MG TABS tablet Take 1 tablet (20 mg total) by mouth daily with supper. 30 tablet 6  . spironolactone (ALDACTONE) 25 MG tablet Take 0.5 tablets (12.5 mg total) by  mouth 2 (two) times daily. 30 tablet 6   No current facility-administered medications for this visit.     Patient confirms/reports the following allergies:  No Known Allergies  No orders of the defined types were placed in this encounter.   AUTHORIZATION INFORMATION Primary Insurance: 1D#: Group #:  Secondary Insurance: 1D#: Group #:  SCHEDULE INFORMATION: Date: 8/21 Time: Location: [facility]

## 2017-02-03 ENCOUNTER — Other Ambulatory Visit: Payer: Self-pay | Admitting: Family Medicine

## 2017-02-03 NOTE — Telephone Encounter (Signed)
Patient called requesting a refill on medication. Patient is unsure of the name of the medication. Patient is aware provider is aware pcp is out of office.   Please Advise.  Thank you

## 2017-02-04 NOTE — Telephone Encounter (Signed)
Routing to provider. Appt on 07/08/17.

## 2017-02-10 ENCOUNTER — Encounter: Admission: RE | Payer: Self-pay | Source: Ambulatory Visit

## 2017-02-10 ENCOUNTER — Ambulatory Visit: Admission: RE | Admit: 2017-02-10 | Payer: Medicaid Other | Source: Ambulatory Visit

## 2017-02-10 SURGERY — COLONOSCOPY WITH PROPOFOL
Anesthesia: General

## 2017-05-18 ENCOUNTER — Other Ambulatory Visit: Payer: Self-pay

## 2017-05-18 NOTE — Telephone Encounter (Signed)
Refill request for metoprolol

## 2017-05-20 MED ORDER — METOPROLOL TARTRATE 25 MG PO TABS: 25.0000 mg | ORAL_TABLET | Freq: Two times a day (BID) | ORAL | 2 refills | Status: DC

## 2017-06-02 IMAGING — CR DG CHEST 1V PORT
1 series · 1 of 1 positions shown · non-contrast
Comparison: CT 03/27/2016.  Chest x-ray 03/27/2016.

CLINICAL DATA: Atrial fibrillation.  Shortness of breath .

EXAM:
PORTABLE CHEST 1 VIEW

[AP]
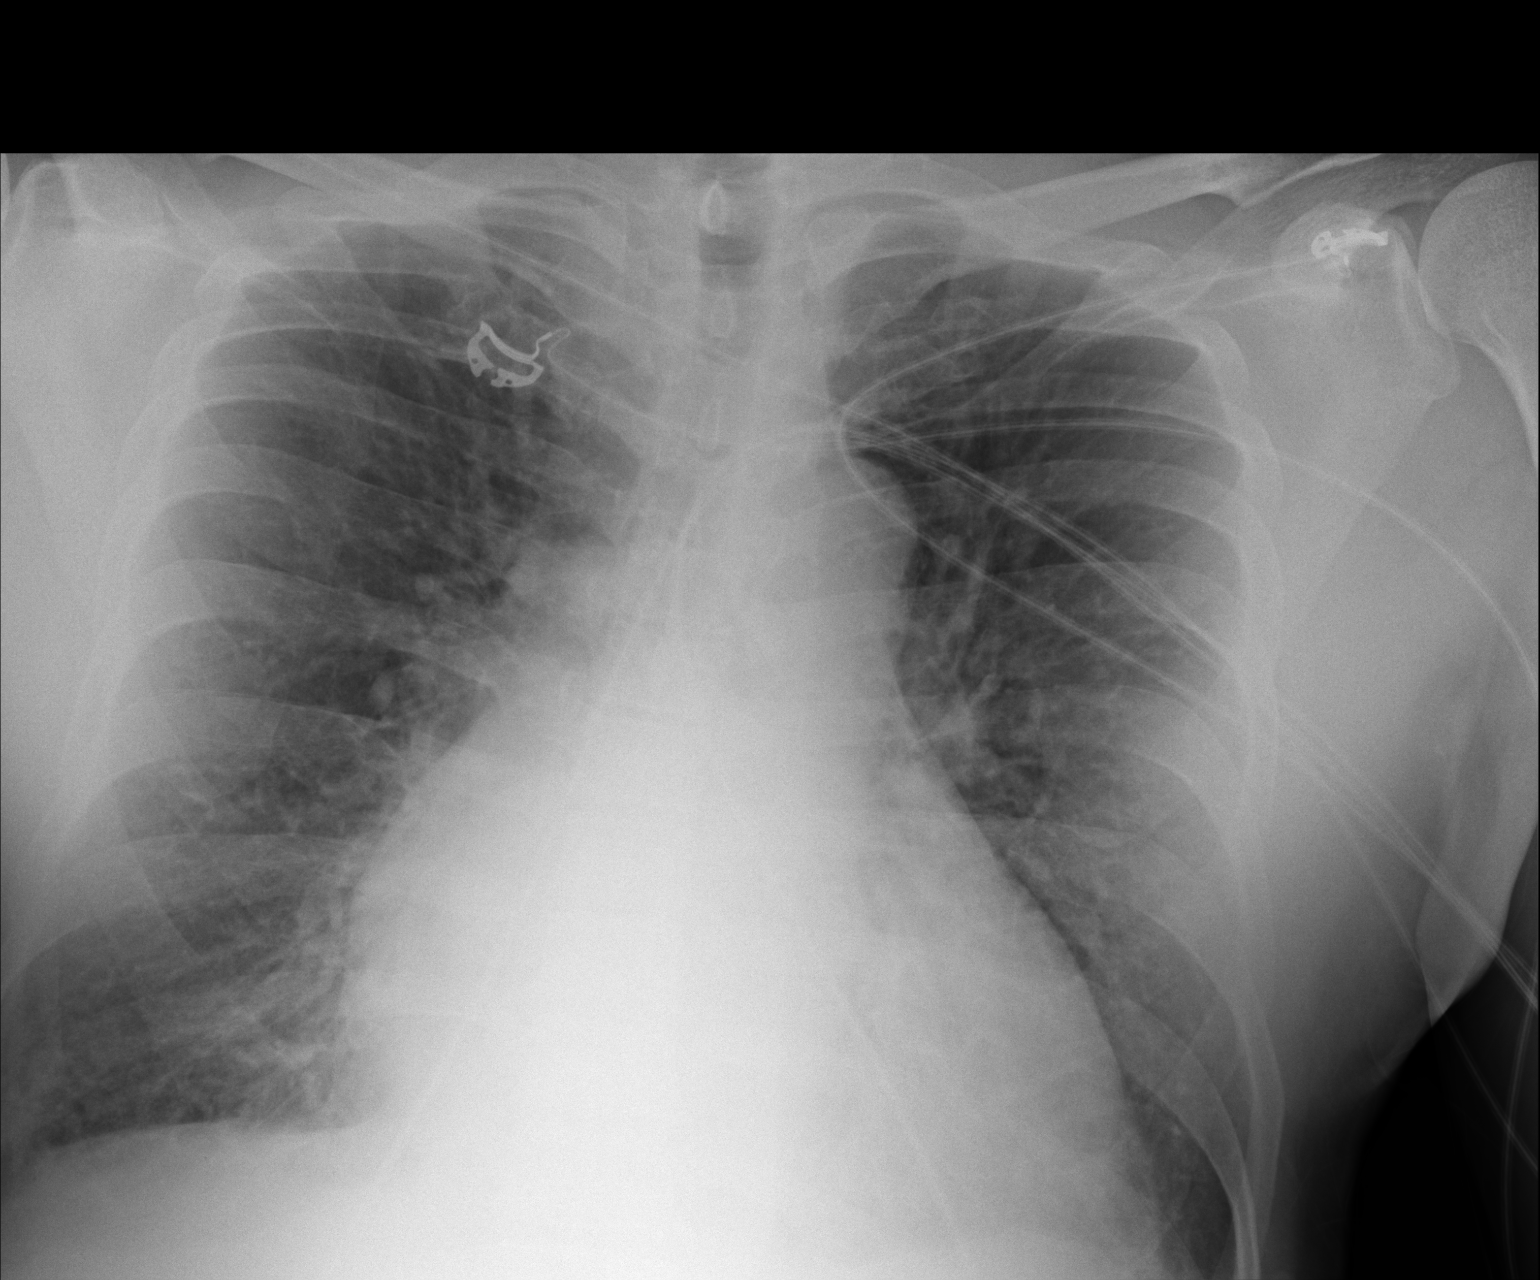

[1 of 1 positions shown; findings below may reference images not displayed]

FINDINGS: Cardiomegaly with pulmonary vascular prominence and bilateral
interstitial prominence suggesting congestive heart failure. As
noted on prior CT bibasilar pneumonia cannot be excluded. Previously
identified density in the right upper lung on CT of 03/27/2016 has
partially cleared. Costophrenic angles not imaged. No pneumothorax .
IMPRESSION: 1. Cardiomegaly with mild bilateral pulmonary interstitial
prominence consistent with mild congestive heart failure.

2. As noted on prior recent CT bibasilar pneumonia cannot be
excluded. Previously identified right upper lobe density/ infiltrate
on CT of 03/27/2016 has partially cleared.

## 2017-06-22 ENCOUNTER — Other Ambulatory Visit: Payer: Self-pay | Admitting: Family Medicine

## 2017-06-22 NOTE — Telephone Encounter (Signed)
Copied from CRM [id number]. Topic: Quick Communication - See Telephone Encounter >> Jun 22, 2017  2:52 PM Terisa Starr wrote: CRM for notification. See Telephone encounter for:   06/22/17. Pt is requesting potassium chloride 20mg  & bidil. Pharmacy is [facility] on [address]

## 2017-06-24 MED ORDER — POTASSIUM CHLORIDE CRYS ER 20 MEQ PO TBCR: 20.0000 meq | EXTENDED_RELEASE_TABLET | Freq: Two times a day (BID) | ORAL | 6 refills | Status: DC

## 2017-06-24 MED ORDER — ISOSORB DINITRATE-HYDRALAZINE 20-37.5 MG PO TABS: 1.0000 | ORAL_TABLET | Freq: Three times a day (TID) | ORAL | 6 refills | Status: DC

## 2017-06-24 NOTE — Telephone Encounter (Signed)
Last visit annual exam 01/15/2017

## 2017-07-08 ENCOUNTER — Ambulatory Visit: Payer: Medicaid Other | Admitting: Family Medicine

## 2017-07-08 VITALS — BP 111/76 | HR 67 | Temp 99.2°F | Resp 16 | Wt 268.2 lb

## 2017-07-08 DIAGNOSIS — I4891 Unspecified atrial fibrillation: Secondary | ICD-10-CM | POA: Diagnosis not present

## 2017-07-08 DIAGNOSIS — I10 Essential (primary) hypertension: Secondary | ICD-10-CM

## 2017-07-08 DIAGNOSIS — I5023 Acute on chronic systolic (congestive) heart failure: Secondary | ICD-10-CM

## 2017-07-08 NOTE — Progress Notes (Signed)
BP 111/76 (BP Location: Right Arm)   Pulse 67   Temp 99.2 F (37.3 C) (Oral)   Resp 16   Wt 268 lb 3.2 oz (121.7 kg)   BMI 34.43 kg/m    Subjective:    Patient ID: Dwayne Ramirez, male    DOB: 1961/02/22, 57 y.o.   MRN: [id number]  HPI: Dwayne Ramirez is a 57 y.o. male  Chief Complaint  Patient presents with  . Follow-up    Blood Pressure   Pt here today for 6 month f/u. Managed loosely by Cardiology, pt does not want regular follow ups, just as needed care from specialist despite recommendations.   BPs continue to be well controlled on current regimen, no hypotensive spells, dizziness, CP, HAs. Compliant with medications, no side effects.   Pt denies SOB, leg swelling. Taking lasix, spirolonactone, and potassium.   Afib under good control, no palpitations, CP. Taking xarelto and metformin. No bleeding or bruising issues noted.   Past Medical History:  Diagnosis Date  . A-fib (HCC)   . Acute on chronic left systolic heart failure (HCC) 03/2016  . Dilated cardiomyopathy (HCC)   . Hypertension   . Obesity    Social History   Socioeconomic History  . Marital status: Single    Spouse name: Not on file  . Number of children: Not on file  . Years of education: Not on file  . Highest education level: Not on file  Social Needs  . Financial resource strain: Not on file  . Food insecurity - worry: Not on file  . Food insecurity - inability: Not on file  . Transportation needs - medical: Not on file  . Transportation needs - non-medical: Not on file  Occupational History  . Not on file  Tobacco Use  . Smoking status: Never Smoker  . Smokeless tobacco: Never Used  Substance and Sexual Activity  . Alcohol use: No  . Drug use: No  . Sexual activity: Not on file  Other Topics Concern  . Not on file  Social History Narrative  . Not on file   Relevant past medical, surgical, family and social history reviewed and updated as indicated. Interim medical history since our  last visit reviewed. Allergies and medications reviewed and updated.  Review of Systems  Constitutional: Negative.   HENT: Negative.   Respiratory: Negative.   Cardiovascular: Negative.   Gastrointestinal: Negative.   Genitourinary: Negative.   Musculoskeletal: Negative.   Neurological: Negative.   Psychiatric/Behavioral: Negative.     Per HPI unless specifically indicated above     Objective:    BP 111/76 (BP Location: Right Arm)   Pulse 67   Temp 99.2 F (37.3 C) (Oral)   Resp 16   Wt 268 lb 3.2 oz (121.7 kg)   BMI 34.43 kg/m   Wt Readings from Last 3 Encounters:  07/08/17 268 lb 3.2 oz (121.7 kg)  01/15/17 261 lb (118.4 kg)  12/16/16 263 lb (119.3 kg)    Physical Exam  Constitutional: He is oriented to person, place, and time. He appears well-developed and well-nourished. No distress.  HENT:  Head: Atraumatic.  Eyes: Conjunctivae are normal. Pupils are equal, round, and reactive to light. No scleral icterus.  Neck: Normal range of motion. Neck supple.  Cardiovascular: Normal rate and intact distal pulses.  Pulmonary/Chest: Effort normal and breath sounds normal. No respiratory distress. He has no wheezes.  Musculoskeletal: Normal range of motion.  Neurological: He is alert and oriented to person, place,  and time.  Skin: Skin is warm and dry.  Psychiatric: He has a normal mood and affect. His behavior is normal.  Nursing note and vitals reviewed.  Results for orders placed or performed in visit on 07/08/17  Basic Metabolic Panel (BMET)  Result Value Ref Range   Glucose 92 65 - 99 mg/dL   BUN 16 6 - 24 mg/dL   Creatinine, Ser 5.37 (H) 0.76 - 1.27 mg/dL   GFR calc non Af Amer 31 (L) >59 mL/min/1.73   GFR calc Af Amer 36 (L) >59 mL/min/1.73   BUN/Creatinine Ratio 7 (L) 9 - 20   Sodium 143 134 - 144 mmol/L   Potassium 5.4 (H) 3.5 - 5.2 mmol/L   Chloride 105 96 - 106 mmol/L   CO2 20 20 - 29 mmol/L   Calcium 9.7 8.7 - 10.2 mg/dL      Assessment & Plan:    Problem List Items Addressed This Visit      Cardiovascular and Mediastinum   Acute on chronic left systolic heart failure (HCC)    Sxs well controlled with no exacerbations. Continue current regimen per Cardiology, await labs.      Relevant Medications   diltiazem (CARDIZEM) 90 MG tablet   A-fib (HCC)    Rate well controlled, no issues per patient. Continue current regimen      Relevant Medications   diltiazem (CARDIZEM) 90 MG tablet   Hypertension - Primary    BPs continue to be under good control. Continue current regimen      Relevant Medications   diltiazem (CARDIZEM) 90 MG tablet   Other Relevant Orders   Basic Metabolic Panel (BMET) (Completed)       Follow up plan: Return in about 6 months (around 01/05/2018) for CPE.

## 2017-07-09 LAB — BASIC METABOLIC PANEL
BUN/Creatinine Ratio: 7 — ABNORMAL LOW (ref 9–20)
BUN: 16 mg/dL (ref 6–24)
CALCIUM: 9.7 mg/dL (ref 8.7–10.2)
CO2: 20 mmol/L (ref 20–29)
CREATININE: 2.24 mg/dL — AB (ref 0.76–1.27)
Chloride: 105 mmol/L (ref 96–106)
GFR calc Af Amer: 36 mL/min/{1.73_m2} — ABNORMAL LOW (ref >59)
GFR, EST NON AFRICAN AMERICAN: 31 mL/min/{1.73_m2} — AB (ref >59)
GLUCOSE: 92 mg/dL (ref 65–99)
Potassium: 5.4 mmol/L — ABNORMAL HIGH (ref 3.5–5.2)
SODIUM: 143 mmol/L (ref 134–144)

## 2017-07-10 ENCOUNTER — Other Ambulatory Visit: Payer: Self-pay | Admitting: Family Medicine

## 2017-07-10 DIAGNOSIS — E875 Hyperkalemia: Secondary | ICD-10-CM

## 2017-07-11 NOTE — Assessment & Plan Note (Signed)
Sxs well controlled with no exacerbations. Continue current regimen per Cardiology, await labs.

## 2017-07-11 NOTE — Patient Instructions (Signed)
Follow up for CPE 

## 2017-07-11 NOTE — Assessment & Plan Note (Signed)
BPs continue to be under good control. Continue current regimen

## 2017-07-11 NOTE — Assessment & Plan Note (Signed)
Rate well controlled, no issues per patient. Continue current regimen

## 2017-07-13 ENCOUNTER — Telehealth: Payer: Self-pay | Admitting: Family Medicine

## 2017-07-13 MED ORDER — DILTIAZEM HCL 90 MG PO TABS: 90.0000 mg | ORAL_TABLET | Freq: Two times a day (BID) | ORAL | 2 refills | Status: DC

## 2017-07-13 NOTE — Telephone Encounter (Signed)
Copied from CRM [id number]. Topic: Quick Communication - Rx Refill/Question >> Jul 13, 2017  3:01 PM Percival Spanish wrote: Medication  diltiazem (CARDIZEM) 90 MG tablet   Has the patient contacted their pharmacy yes     Preferred Pharmacy   with phone number or street name   [facility]    Agent: Please be advised that RX refills may take up to 3 business days. We ask that you follow-up with your pharmacy.

## 2017-07-13 NOTE — Telephone Encounter (Signed)
Refills sent

## 2017-07-19 NOTE — Progress Notes (Signed)
Unable to mail certified letter due to incorrect address.  Patient reached by phone. Spoke with Myrtha Mantis, CMA regarding lab results.

## 2017-07-24 ENCOUNTER — Other Ambulatory Visit: Payer: Medicaid Other

## 2017-07-24 DIAGNOSIS — E875 Hyperkalemia: Secondary | ICD-10-CM

## 2017-07-25 LAB — BASIC METABOLIC PANEL
BUN/Creatinine Ratio: 11 (ref 9–20)
BUN: 13 mg/dL (ref 6–24)
CALCIUM: 10 mg/dL (ref 8.7–10.2)
CHLORIDE: 104 mmol/L (ref 96–106)
CO2: 23 mmol/L (ref 20–29)
Creatinine, Ser: 1.17 mg/dL (ref 0.76–1.27)
GFR calc Af Amer: 80 mL/min/{1.73_m2} (ref >59)
GFR, EST NON AFRICAN AMERICAN: 69 mL/min/{1.73_m2} (ref >59)
GLUCOSE: 92 mg/dL (ref 65–99)
POTASSIUM: 5.1 mmol/L (ref 3.5–5.2)
Sodium: 145 mmol/L — ABNORMAL HIGH (ref 134–144)

## 2017-07-30 ENCOUNTER — Other Ambulatory Visit: Payer: Self-pay

## 2017-07-30 MED ORDER — SPIRONOLACTONE 25 MG PO TABS: 12.5000 mg | ORAL_TABLET | Freq: Two times a day (BID) | ORAL | 6 refills | Status: DC

## 2017-07-30 MED ORDER — FUROSEMIDE 40 MG PO TABS: 40.0000 mg | ORAL_TABLET | Freq: Every day | ORAL | 6 refills | Status: DC

## 2017-07-30 NOTE — Telephone Encounter (Signed)
Rx Refill request for Lasix 40mg  and Spironolactone 25mg  Last OV: 07/13/2017  Orpah Cobb, MD last filled these prescriptions in the past 04/02/2018 w/ 6 refills.

## 2017-07-31 ENCOUNTER — Telehealth: Payer: Self-pay | Admitting: Family Medicine

## 2017-07-31 NOTE — Telephone Encounter (Signed)
Pt. Called to report [facility] did not receive his refills of Lasix or Aldactone. Called and gave verbal refill order to Drisda(pharmacist).

## 2017-08-18 ENCOUNTER — Other Ambulatory Visit: Payer: Self-pay

## 2017-08-18 MED ORDER — DILTIAZEM HCL 90 MG PO TABS: 90.0000 mg | ORAL_TABLET | Freq: Two times a day (BID) | ORAL | 2 refills | Status: DC

## 2017-08-25 ENCOUNTER — Encounter: Payer: Self-pay | Admitting: Family Medicine

## 2017-08-30 ENCOUNTER — Other Ambulatory Visit: Payer: Self-pay | Admitting: Family Medicine

## 2017-11-30 ENCOUNTER — Other Ambulatory Visit: Payer: Self-pay | Admitting: Family Medicine

## 2017-12-01 NOTE — Telephone Encounter (Signed)
Diltiazem refill Last Refill:08/18/17 # 60 2 RF Last OV: 07/08/17 PCP: Roosvelt Maser PA-C Pharmacy:[facility] [address]

## 2017-12-21 ENCOUNTER — Telehealth: Payer: Self-pay | Admitting: Family Medicine

## 2017-12-21 DIAGNOSIS — I4891 Unspecified atrial fibrillation: Secondary | ICD-10-CM

## 2017-12-21 DIAGNOSIS — I5023 Acute on chronic systolic (congestive) heart failure: Secondary | ICD-10-CM

## 2017-12-21 DIAGNOSIS — I420 Dilated cardiomyopathy: Secondary | ICD-10-CM

## 2017-12-21 NOTE — Telephone Encounter (Signed)
Patient called and to verify the correct Pharmacy to send refill to, since the profile indicates the last refill was 04/02/16, he says "that is not true. I receive this medication every time it's due for a refill and I get it at [facility] on [address]."  Xarelto refill Last OV:07/08/17 Last refill:04/02/16 by another provider VAP:OLID Pharmacy: [facility] [id number] - [place], [place] - [address] AT [facility] [address] [phone] (Phone) [phone] (Fax)

## 2017-12-21 NOTE — Telephone Encounter (Signed)
Can you verify with the pharmacy who has been giving him his refills? I think his Cardiologist was managing this in the past.

## 2017-12-22 NOTE — Telephone Encounter (Signed)
Tried calling patient, no answer no VM box set up. Will try again tomorrow.

## 2017-12-22 NOTE — Telephone Encounter (Signed)
Patient retuning Dwayne Ramirez phone call. Please contact back.

## 2017-12-22 NOTE — Telephone Encounter (Signed)
I will bridge him with his regular medications if needed until his upcoming CPE, let me know. I have never given him the xarelto, let's continue off of this until I can get him in with a Cardiologist - I'll place a referral now. I was unaware that he never followed up as outpatient with Cardiology - misunderstood the patient.

## 2017-12-22 NOTE — Telephone Encounter (Signed)
Spoke with patient the Xarelto came from Dr. Orpah Cobb in the ER 04/02/2016 w/ 6 refills. He was advised to F/U with cardiology. Patient became aggravated and stated that all he has to do is call and ask for a refill, and it gets refilled by Fleet Contras. Patient stated "now, nobody knows what they're doing." I explained to patient again, the medication didn't come from her and asked has he been seeing a cardiologist. Patient stated that "we are his doctor, who's a cardiologist." No record of seeing a cardiologist. I explained to patient he may need to come in for an appointment to go over his medications, and possibly blood work since patient hasn't been seen since January 2019. Patient stated that he must not have to be on Xarelto. I explained to patient that that wasn't the situation, we just need to make sure everything is on the right track and get you into see a cardiologist. I asked patient if I could schedule him an appointment and I could get him in as early as tomorrow. Patient asked "who wants the blood work, you or Fleet Contras" I explained to patient that I didn't know if Fleet Contras would require blood work-but it is a possibility since patient hasn't been seen since 06/2017. Patient told me to ask Fleet Contras first and then let him know, patient stated "I don't like this stuff. Ask Fleet Contras first." I explained to patient I would and I would call him back as soon as I got an answer. Patient said ok and hung up.   Remained professional and empathetic during this conversation. Patient was aggravated and seemed to be confused on what I was trying to explain to him.

## 2017-12-25 NOTE — Telephone Encounter (Signed)
Patient said he was fine with his other medications. He stated he'd [place]/d a f/u appt with his heart doctor for his xarelto.

## 2017-12-25 NOTE — Telephone Encounter (Signed)
Tried calling patient back. No answer, and no VM set up. Will try again.

## 2017-12-31 DIAGNOSIS — I425 Other restrictive cardiomyopathy: Secondary | ICD-10-CM | POA: Diagnosis not present

## 2017-12-31 DIAGNOSIS — I482 Chronic atrial fibrillation: Secondary | ICD-10-CM | POA: Diagnosis not present

## 2017-12-31 DIAGNOSIS — I10 Essential (primary) hypertension: Secondary | ICD-10-CM | POA: Diagnosis not present

## 2017-12-31 DIAGNOSIS — E663 Overweight: Secondary | ICD-10-CM | POA: Diagnosis not present

## 2018-01-01 ENCOUNTER — Other Ambulatory Visit: Payer: Self-pay | Admitting: Family Medicine

## 2018-01-13 ENCOUNTER — Other Ambulatory Visit: Payer: Self-pay | Admitting: Family Medicine

## 2018-01-18 ENCOUNTER — Encounter: Payer: Medicaid Other | Admitting: Family Medicine

## 2018-02-02 DIAGNOSIS — E663 Overweight: Secondary | ICD-10-CM | POA: Diagnosis not present

## 2018-02-02 DIAGNOSIS — I10 Essential (primary) hypertension: Secondary | ICD-10-CM | POA: Diagnosis not present

## 2018-02-02 DIAGNOSIS — I425 Other restrictive cardiomyopathy: Secondary | ICD-10-CM | POA: Diagnosis not present

## 2018-02-02 DIAGNOSIS — I482 Chronic atrial fibrillation: Secondary | ICD-10-CM | POA: Diagnosis not present

## 2018-02-05 ENCOUNTER — Other Ambulatory Visit: Payer: Self-pay | Admitting: Family Medicine

## 2018-02-09 ENCOUNTER — Ambulatory Visit (INDEPENDENT_AMBULATORY_CARE_PROVIDER_SITE_OTHER): Payer: Medicaid Other | Admitting: Family Medicine

## 2018-02-09 ENCOUNTER — Encounter: Payer: Self-pay | Admitting: Family Medicine

## 2018-02-09 VITALS — BP 100/72 | HR 61 | Ht 74.61 in | Wt 265.1 lb

## 2018-02-09 DIAGNOSIS — Z0000 Encounter for general adult medical examination without abnormal findings: Secondary | ICD-10-CM

## 2018-02-09 DIAGNOSIS — Z1211 Encounter for screening for malignant neoplasm of colon: Secondary | ICD-10-CM

## 2018-02-09 DIAGNOSIS — I420 Dilated cardiomyopathy: Secondary | ICD-10-CM | POA: Diagnosis not present

## 2018-02-09 DIAGNOSIS — I4891 Unspecified atrial fibrillation: Secondary | ICD-10-CM | POA: Diagnosis not present

## 2018-02-09 DIAGNOSIS — I5023 Acute on chronic systolic (congestive) heart failure: Secondary | ICD-10-CM | POA: Diagnosis not present

## 2018-02-09 DIAGNOSIS — I10 Essential (primary) hypertension: Secondary | ICD-10-CM

## 2018-02-09 LAB — UA/M W/RFLX CULTURE, ROUTINE
Bilirubin, UA: NEGATIVE
GLUCOSE, UA: NEGATIVE
KETONES UA: NEGATIVE
Nitrite, UA: NEGATIVE
RBC, UA: NEGATIVE
SPEC GRAV UA: 1.025 (ref 1.005–1.030)
Urobilinogen, Ur: 1 mg/dL (ref 0.2–1.0)
pH, UA: 5 (ref 5.0–7.5)

## 2018-02-09 LAB — MICROSCOPIC EXAMINATION

## 2018-02-09 MED ORDER — FUROSEMIDE 40 MG PO TABS: 40.0000 mg | ORAL_TABLET | Freq: Every day | ORAL | 1 refills | Status: DC

## 2018-02-09 MED ORDER — DILTIAZEM HCL 90 MG PO TABS: 90.0000 mg | ORAL_TABLET | Freq: Two times a day (BID) | ORAL | 1 refills | Status: DC

## 2018-02-09 MED ORDER — ISOSORB DINITRATE-HYDRALAZINE 20-37.5 MG PO TABS: 1.0000 | ORAL_TABLET | Freq: Three times a day (TID) | ORAL | 1 refills | Status: DC

## 2018-02-09 MED ORDER — SPIRONOLACTONE 25 MG PO TABS: 12.5000 mg | ORAL_TABLET | Freq: Two times a day (BID) | ORAL | 1 refills | Status: DC

## 2018-02-09 MED ORDER — METOPROLOL TARTRATE 25 MG PO TABS: 25.0000 mg | ORAL_TABLET | Freq: Two times a day (BID) | ORAL | 1 refills | Status: DC

## 2018-02-09 NOTE — Progress Notes (Signed)
BP 100/72   Pulse 61   Ht 6' 2.61" (1.895 m)   Wt 265 lb 1.6 oz (120.2 kg)   SpO2 98%   BMI 33.49 kg/m    Subjective:    Patient ID: Dwayne Ramirez, male    DOB: 03/22/1961, 57 y.o.   MRN: [id number]  HPI: Dwayne Ramirez is a 57 y.o. male presenting on 02/09/2018 for comprehensive medical examination. Current medical complaints include:none  Now seeing the cardiologist - back on the xarelto. Still off the potassium.  No edema, CP, SOB, syncope. BPs and HR continue to be well controlled.   Depression Screen done today and results listed below:  Depression screen [facility] 2/9 02/09/2018 01/15/2017  Decreased Interest 3 0  Down, Depressed, Hopeless 0 0  PHQ - 2 Score 3 0  Altered sleeping 3 -  Tired, decreased energy 3 -  Change in appetite 3 -  Feeling bad or failure about yourself  0 -  Trouble concentrating 0 -  Moving slowly or fidgety/restless 0 -  Suicidal thoughts 0 -  PHQ-9 Score 12 -  Difficult doing work/chores Somewhat difficult -    The patient does not have a history of falls. I did not complete a risk assessment for falls. A plan of care for falls was not documented.   Past Medical History:  Past Medical History:  Diagnosis Date  . A-fib (HCC)   . Acute on chronic left systolic heart failure (HCC) 03/2016  . Dilated cardiomyopathy (HCC)   . Hypertension   . Obesity     Surgical History:  No past surgical history on file.  Medications:  Current Outpatient Medications on File Prior to Visit  Medication Sig  . cloNIDine (CATAPRES) 0.2 MG tablet Take 1 tablet (0.2 mg total) by mouth 2 (two) times daily.  Marland Kitchen losartan (COZAAR) 100 MG tablet Take 1 tablet (100 mg total) by mouth daily.  . rivaroxaban (XARELTO) 20 MG TABS tablet Take 1 tablet (20 mg total) by mouth daily with supper.   No current facility-administered medications on file prior to visit.     Allergies:  No Known Allergies  Social History:  Social History   Socioeconomic History  . Marital  status: Single    Spouse name: Not on file  . Number of children: Not on file  . Years of education: Not on file  . Highest education level: Not on file  Occupational History  . Not on file  Social Needs  . Financial resource strain: Not on file  . Food insecurity:    Worry: Not on file    Inability: Not on file  . Transportation needs:    Medical: Not on file    Non-medical: Not on file  Tobacco Use  . Smoking status: Never Smoker  . Smokeless tobacco: Never Used  Substance and Sexual Activity  . Alcohol use: No  . Drug use: No  . Sexual activity: Not on file  Lifestyle  . Physical activity:    Days per week: Not on file    Minutes per session: Not on file  . Stress: Not on file  Relationships  . Social connections:    Talks on phone: Not on file    Gets together: Not on file    Attends religious service: Not on file    Active member of club or organization: Not on file    Attends meetings of clubs or organizations: Not on file    Relationship status: Not on file  .  Intimate partner violence:    Fear of current or ex partner: Not on file    Emotionally abused: Not on file    Physically abused: Not on file    Forced sexual activity: Not on file  Other Topics Concern  . Not on file  Social History Narrative  . Not on file   Social History   Tobacco Use  Smoking Status Never Smoker  Smokeless Tobacco Never Used   Social History   Substance and Sexual Activity  Alcohol Use No    Family History:  Family History  Problem Relation Age of Onset  . Hypertension Mother   . Hypertension Father   . Cancer Neg Hx   . Diabetes Neg Hx   . COPD Neg Hx   . Heart disease Neg Hx   . Stroke Neg Hx     Past medical history, surgical history, medications, allergies, family history and social history reviewed with patient today and changes made to appropriate areas of the chart.   Review of Systems - General ROS: negative Psychological ROS: negative Ophthalmic ROS:  negative ENT ROS: negative Allergy and Immunology ROS: negative Respiratory ROS: no cough, shortness of breath, or wheezing Cardiovascular ROS: no chest pain or dyspnea on exertion Gastrointestinal ROS: no abdominal pain, change in bowel habits, or black or bloody stools Genito-Urinary ROS: no dysuria, trouble voiding, or hematuria Musculoskeletal ROS: negative Neurological ROS: no TIA or stroke symptoms Dermatological ROS: negative All other ROS negative except what is listed above and in the HPI.      Objective:    BP 100/72   Pulse 61   Ht 6' 2.61" (1.895 m)   Wt 265 lb 1.6 oz (120.2 kg)   SpO2 98%   BMI 33.49 kg/m   Wt Readings from Last 3 Encounters:  02/09/18 265 lb 1.6 oz (120.2 kg)  07/08/17 268 lb 3.2 oz (121.7 kg)  01/15/17 261 lb (118.4 kg)    Physical Exam  Constitutional: He is oriented to person, place, and time. He appears well-developed and well-nourished. No distress.  HENT:  Head: Atraumatic.  Right Ear: External ear normal.  Left Ear: External ear normal.  Nose: Nose normal.  Mouth/Throat: Oropharynx is clear and moist.  Eyes: Pupils are equal, round, and reactive to light. Conjunctivae are normal. No scleral icterus.  Neck: Normal range of motion. Neck supple.  Cardiovascular: Normal rate, normal heart sounds and intact distal pulses.  No murmur heard. Pulmonary/Chest: Effort normal and breath sounds normal. No respiratory distress.  Abdominal: Soft. Bowel sounds are normal. He exhibits no distension and no mass. There is no tenderness. There is no guarding.  Genitourinary:  Genitourinary Comments: GU exam declined  Musculoskeletal: Normal range of motion. He exhibits no edema or tenderness.  Neurological: He is alert and oriented to person, place, and time. He has normal reflexes.  Skin: Skin is warm and dry. No rash noted.  Psychiatric: He has a normal mood and affect. His behavior is normal.  Nursing note and vitals reviewed.   Results for  orders placed or performed in visit on 02/09/18  Microscopic Examination  Result Value Ref Range   WBC, UA 0-5 0 - 5 /hpf   RBC, UA 0-2 0 - 2 /hpf   Epithelial Cells (non renal) 0-10 0 - 10 /hpf   Renal Epithel, UA 0-10 (A) None seen /hpf   Bacteria, UA Few None seen/Few  CBC with Differential/Platelet  Result Value Ref Range   WBC 4.5 3.4 -  10.8 x10E3/uL   RBC 5.16 4.14 - 5.80 x10E6/uL   Hemoglobin 16.0 13.0 - 17.7 g/dL   Hematocrit 40.9 81.1 - 51.0 %   MCV 90 79 - 97 fL   MCH 31.0 26.6 - 33.0 pg   MCHC 34.5 31.5 - 35.7 g/dL   RDW 91.4 78.2 - 95.6 %   Platelets 236 150 - 450 x10E3/uL   Neutrophils 36 Not Estab. %   Lymphs 51 Not Estab. %   Monocytes 11 Not Estab. %   Eos 2 Not Estab. %   Basos 0 Not Estab. %   Neutrophils Absolute 1.6 1.4 - 7.0 x10E3/uL   Lymphocytes Absolute 2.3 0.7 - 3.1 x10E3/uL   Monocytes Absolute 0.5 0.1 - 0.9 x10E3/uL   EOS (ABSOLUTE) 0.1 0.0 - 0.4 x10E3/uL   Basophils Absolute 0.0 0.0 - 0.2 x10E3/uL   Immature Granulocytes 0 Not Estab. %   Immature Grans (Abs) 0.0 0.0 - 0.1 x10E3/uL  Comprehensive metabolic panel  Result Value Ref Range   Glucose 105 (H) 65 - 99 mg/dL   BUN 11 6 - 24 mg/dL   Creatinine, Ser 2.13 (H) 0.76 - 1.27 mg/dL   GFR calc non Af Amer 55 (L) >59 mL/min/1.73   GFR calc Af Amer 63 >59 mL/min/1.73   BUN/Creatinine Ratio 8 (L) 9 - 20   Sodium 141 134 - 144 mmol/L   Potassium 4.7 3.5 - 5.2 mmol/L   Chloride 104 96 - 106 mmol/L   CO2 21 20 - 29 mmol/L   Calcium 9.7 8.7 - 10.2 mg/dL   Total Protein 6.8 6.0 - 8.5 g/dL   Albumin 4.0 3.5 - 5.5 g/dL   Globulin, Total 2.8 1.5 - 4.5 g/dL   Albumin/Globulin Ratio 1.4 1.2 - 2.2   Bilirubin Total 0.7 0.0 - 1.2 mg/dL   Alkaline Phosphatase 77 39 - 117 IU/L   AST 56 (H) 0 - 40 IU/L   ALT 79 (H) 0 - 44 IU/L  Lipid Panel w/o Chol/HDL Ratio  Result Value Ref Range   Cholesterol, Total 150 100 - 199 mg/dL   Triglycerides 76 0 - 149 mg/dL   HDL 34 (L) >08 mg/dL   VLDL Cholesterol Cal  15 5 - 40 mg/dL   LDL Calculated 657 (H) 0 - 99 mg/dL  TSH  Result Value Ref Range   TSH 2.370 0.450 - 4.500 uIU/mL  UA/M w/rflx Culture, Routine  Result Value Ref Range   Specific Gravity, UA 1.025 1.005 - 1.030   pH, UA 5.0 5.0 - 7.5   Color, UA Orange Yellow   Appearance Ur Cloudy (A) Clear   Leukocytes, UA Trace (A) Negative   Protein, UA Trace (A) Negative/Trace   Glucose, UA Negative Negative   Ketones, UA Negative Negative   RBC, UA Negative Negative   Bilirubin, UA Negative Negative   Urobilinogen, Ur 1.0 0.2 - 1.0 mg/dL   Nitrite, UA Negative Negative   Microscopic Examination See below:       Assessment & Plan:   Problem List Items Addressed This Visit      Cardiovascular and Mediastinum   Acute on chronic left systolic heart failure (HCC)   Relevant Medications   diltiazem (CARDIZEM) 90 MG tablet   furosemide (LASIX) 40 MG tablet   isosorbide-hydrALAZINE (BIDIL) 20-37.5 MG tablet   metoprolol tartrate (LOPRESSOR) 25 MG tablet   spironolactone (ALDACTONE) 25 MG tablet   A-fib (HCC)   Relevant Medications   diltiazem (CARDIZEM) 90 MG tablet  furosemide (LASIX) 40 MG tablet   isosorbide-hydrALAZINE (BIDIL) 20-37.5 MG tablet   metoprolol tartrate (LOPRESSOR) 25 MG tablet   spironolactone (ALDACTONE) 25 MG tablet   Hypertension - Primary   Relevant Medications   diltiazem (CARDIZEM) 90 MG tablet   furosemide (LASIX) 40 MG tablet   isosorbide-hydrALAZINE (BIDIL) 20-37.5 MG tablet   metoprolol tartrate (LOPRESSOR) 25 MG tablet   spironolactone (ALDACTONE) 25 MG tablet   Other Relevant Orders   CBC with Differential/Platelet (Completed)   Comprehensive metabolic panel (Completed)   UA/M w/rflx Culture, Routine (Completed)   Dilated cardiomyopathy (HCC)   Relevant Medications   diltiazem (CARDIZEM) 90 MG tablet   furosemide (LASIX) 40 MG tablet   isosorbide-hydrALAZINE (BIDIL) 20-37.5 MG tablet   metoprolol tartrate (LOPRESSOR) 25 MG tablet    spironolactone (ALDACTONE) 25 MG tablet    Other Visit Diagnoses    Screening for colon cancer       Relevant Orders   Cologuard   Annual physical exam       Relevant Orders   Lipid Panel w/o Chol/HDL Ratio (Completed)   TSH (Completed)       Discussed aspirin prophylaxis for myocardial infarction prevention and decision was it was not indicated  LABORATORY TESTING:  Health maintenance labs ordered today as discussed above.   The natural history of prostate cancer and ongoing controversy regarding screening and potential treatment outcomes of prostate cancer has been discussed with the patient. The meaning of a false positive PSA and a false negative PSA has been discussed. He indicates understanding of the limitations of this screening test and wishes not to proceed with screening PSA testing.   IMMUNIZATIONS:   - Tdap: Tetanus vaccination status reviewed: last tetanus booster within 10 years. - Influenza: Refused  SCREENING: - Colonoscopy: cologuard ordered  Discussed with patient purpose of the colonoscopy is to detect colon cancer at curable precancerous or early stages   PATIENT COUNSELING:    Sexuality: Discussed sexually transmitted diseases, partner selection, use of condoms, avoidance of unintended pregnancy  and contraceptive alternatives.   Advised to avoid cigarette smoking.  I discussed with the patient that most people either abstain from alcohol or drink within safe limits (<=14/week and <=4 drinks/occasion for males, <=7/weeks and <= 3 drinks/occasion for females) and that the risk for alcohol disorders and other health effects rises proportionally with the number of drinks per week and how often a drinker exceeds daily limits.  Discussed cessation/primary prevention of drug use and availability of treatment for abuse.   Diet: Encouraged to adjust caloric intake to maintain  or achieve ideal body weight, to reduce intake of dietary saturated fat and total fat,  to limit sodium intake by avoiding high sodium foods and not adding table salt, and to maintain adequate dietary potassium and calcium preferably from fresh fruits, vegetables, and low-fat dairy products.    stressed the importance of regular exercise  Injury prevention: Discussed safety belts, safety helmets, smoke detector, smoking near bedding or upholstery.   Dental health: Discussed importance of regular tooth brushing, flossing, and dental visits.   Follow up plan: NEXT PREVENTATIVE PHYSICAL DUE IN 1 YEAR. Return in about 6 months (around 08/12/2018) for 6 month f/u.

## 2018-02-10 LAB — CBC WITH DIFFERENTIAL/PLATELET
BASOS ABS: 0 10*3/uL (ref 0.0–0.2)
Basos: 0 %
EOS (ABSOLUTE): 0.1 10*3/uL (ref 0.0–0.4)
Eos: 2 %
HEMATOCRIT: 46.4 % (ref 37.5–51.0)
Hemoglobin: 16 g/dL (ref 13.0–17.7)
Immature Grans (Abs): 0 10*3/uL (ref 0.0–0.1)
Immature Granulocytes: 0 %
LYMPHS ABS: 2.3 10*3/uL (ref 0.7–3.1)
Lymphs: 51 %
MCH: 31 pg (ref 26.6–33.0)
MCHC: 34.5 g/dL (ref 31.5–35.7)
MCV: 90 fL (ref 79–97)
MONOS ABS: 0.5 10*3/uL (ref 0.1–0.9)
Monocytes: 11 %
Neutrophils Absolute: 1.6 10*3/uL (ref 1.4–7.0)
Neutrophils: 36 %
Platelets: 236 10*3/uL (ref 150–450)
RBC: 5.16 x10E6/uL (ref 4.14–5.80)
RDW: 14.8 % (ref 12.3–15.4)
WBC: 4.5 10*3/uL (ref 3.4–10.8)

## 2018-02-10 LAB — COMPREHENSIVE METABOLIC PANEL
ALBUMIN: 4 g/dL (ref 3.5–5.5)
ALT: 79 IU/L — ABNORMAL HIGH (ref 0–44)
AST: 56 IU/L — ABNORMAL HIGH (ref 0–40)
Albumin/Globulin Ratio: 1.4 (ref 1.2–2.2)
Alkaline Phosphatase: 77 IU/L (ref 39–117)
BILIRUBIN TOTAL: 0.7 mg/dL (ref 0.0–1.2)
BUN / CREAT RATIO: 8 — AB (ref 9–20)
BUN: 11 mg/dL (ref 6–24)
CALCIUM: 9.7 mg/dL (ref 8.7–10.2)
CHLORIDE: 104 mmol/L (ref 96–106)
CO2: 21 mmol/L (ref 20–29)
Creatinine, Ser: 1.41 mg/dL — ABNORMAL HIGH (ref 0.76–1.27)
GFR, EST AFRICAN AMERICAN: 63 mL/min/{1.73_m2} (ref >59)
GFR, EST NON AFRICAN AMERICAN: 55 mL/min/{1.73_m2} — AB (ref >59)
GLOBULIN, TOTAL: 2.8 g/dL (ref 1.5–4.5)
Glucose: 105 mg/dL — ABNORMAL HIGH (ref 65–99)
POTASSIUM: 4.7 mmol/L (ref 3.5–5.2)
SODIUM: 141 mmol/L (ref 134–144)
TOTAL PROTEIN: 6.8 g/dL (ref 6.0–8.5)

## 2018-02-10 LAB — LIPID PANEL W/O CHOL/HDL RATIO
Cholesterol, Total: 150 mg/dL (ref 100–199)
HDL: 34 mg/dL — AB (ref >39)
LDL Calculated: 101 mg/dL — ABNORMAL HIGH (ref 0–99)
Triglycerides: 76 mg/dL (ref 0–149)
VLDL Cholesterol Cal: 15 mg/dL (ref 5–40)

## 2018-02-10 LAB — TSH: TSH: 2.37 u[IU]/mL (ref 0.450–4.500)

## 2018-02-11 ENCOUNTER — Encounter: Payer: Self-pay | Admitting: Family Medicine

## 2018-02-12 NOTE — Patient Instructions (Signed)
Follow up in 6 months 

## 2018-02-23 ENCOUNTER — Other Ambulatory Visit: Payer: Self-pay | Admitting: Family Medicine

## 2018-03-08 DIAGNOSIS — I10 Essential (primary) hypertension: Secondary | ICD-10-CM | POA: Diagnosis not present

## 2018-03-08 DIAGNOSIS — E663 Overweight: Secondary | ICD-10-CM | POA: Diagnosis not present

## 2018-03-08 DIAGNOSIS — I482 Chronic atrial fibrillation: Secondary | ICD-10-CM | POA: Diagnosis not present

## 2018-03-25 DIAGNOSIS — Z1211 Encounter for screening for malignant neoplasm of colon: Secondary | ICD-10-CM | POA: Diagnosis not present

## 2018-03-25 DIAGNOSIS — Z1212 Encounter for screening for malignant neoplasm of rectum: Secondary | ICD-10-CM | POA: Diagnosis not present

## 2018-03-31 LAB — COLOGUARD: COLOGUARD: NEGATIVE

## 2018-08-12 ENCOUNTER — Other Ambulatory Visit: Payer: Self-pay

## 2018-08-12 ENCOUNTER — Encounter: Payer: Self-pay | Admitting: Family Medicine

## 2018-08-12 ENCOUNTER — Ambulatory Visit: Payer: Medicaid Other | Admitting: Family Medicine

## 2018-08-12 VITALS — BP 108/68 | HR 51 | Temp 98.4°F | Ht 74.61 in | Wt 273.2 lb

## 2018-08-12 DIAGNOSIS — I420 Dilated cardiomyopathy: Secondary | ICD-10-CM | POA: Diagnosis not present

## 2018-08-12 DIAGNOSIS — I5023 Acute on chronic systolic (congestive) heart failure: Secondary | ICD-10-CM

## 2018-08-12 DIAGNOSIS — I4891 Unspecified atrial fibrillation: Secondary | ICD-10-CM | POA: Diagnosis not present

## 2018-08-12 DIAGNOSIS — I10 Essential (primary) hypertension: Secondary | ICD-10-CM

## 2018-08-12 NOTE — Progress Notes (Signed)
BP 108/68 (BP Location: Right Arm, Patient Position: Sitting, Cuff Size: Normal)   Pulse (!) 51   Temp 98.4 F (36.9 C) (Oral)   Ht 6' 2.61" (1.895 m)   Wt 273 lb 4 oz (123.9 kg)   SpO2 95%   BMI 34.52 kg/m    Subjective:    Patient ID: Dwayne Ramirez, male    DOB: 06/08/1961, 58 y.o.   MRN: [id number]  HPI: Dwayne Ramirez is a 58 y.o. male  Chief Complaint  Patient presents with  . Hypertension    follow up    Here today for 6 month f/u.   Unable to get in with Cardiology as recommended due to an outstanding balance or billing issue (pt unclear). Has not seen Cardiology now for several years despite multiple recommendations to do so.   Compliant with medication regimen, taking without side effects, edema, orthopnea, CP, SOB, palpitations. No bleeding or bruising issues with xarelto. Rate well controlled for the most part. Does not follow BPs closely at home.   Past Medical History:  Diagnosis Date  . A-fib (HCC)   . Acute on chronic left systolic heart failure (HCC) 03/2016  . Dilated cardiomyopathy (HCC)   . Hypertension   . Obesity    Social History   Socioeconomic History  . Marital status: Single    Spouse name: Not on file  . Number of children: Not on file  . Years of education: Not on file  . Highest education level: Not on file  Occupational History  . Not on file  Social Needs  . Financial resource strain: Not on file  . Food insecurity:    Worry: Not on file    Inability: Not on file  . Transportation needs:    Medical: Not on file    Non-medical: Not on file  Tobacco Use  . Smoking status: Never Smoker  . Smokeless tobacco: Never Used  Substance and Sexual Activity  . Alcohol use: No  . Drug use: No  . Sexual activity: Not on file  Lifestyle  . Physical activity:    Days per week: Not on file    Minutes per session: Not on file  . Stress: Not on file  Relationships  . Social connections:    Talks on phone: Not on file    Gets together:  Not on file    Attends religious service: Not on file    Active member of club or organization: Not on file    Attends meetings of clubs or organizations: Not on file    Relationship status: Not on file  . Intimate partner violence:    Fear of current or ex partner: Not on file    Emotionally abused: Not on file    Physically abused: Not on file    Forced sexual activity: Not on file  Other Topics Concern  . Not on file  Social History Narrative  . Not on file    Relevant past medical, surgical, family and social history reviewed and updated as indicated. Interim medical history since our last visit reviewed. Allergies and medications reviewed and updated.  Review of Systems  Per HPI unless specifically indicated above     Objective:    BP 108/68 (BP Location: Right Arm, Patient Position: Sitting, Cuff Size: Normal)   Pulse (!) 51   Temp 98.4 F (36.9 C) (Oral)   Ht 6' 2.61" (1.895 m)   Wt 273 lb 4 oz (123.9 kg)   SpO2  95%   BMI 34.52 kg/m   Wt Readings from Last 3 Encounters:  08/12/18 273 lb 4 oz (123.9 kg)  02/09/18 265 lb 1.6 oz (120.2 kg)  07/08/17 268 lb 3.2 oz (121.7 kg)    Physical Exam Vitals signs and nursing note reviewed.  Constitutional:      Appearance: Normal appearance.  HENT:     Head: Atraumatic.  Eyes:     Extraocular Movements: Extraocular movements intact.     Conjunctiva/sclera: Conjunctivae normal.  Neck:     Musculoskeletal: Normal range of motion and neck supple.  Cardiovascular:     Rate and Rhythm: Normal rate and regular rhythm.  Pulmonary:     Effort: Pulmonary effort is normal.     Breath sounds: Normal breath sounds.  Musculoskeletal: Normal range of motion.  Skin:    General: Skin is warm and dry.  Neurological:     General: No focal deficit present.     Mental Status: He is oriented to person, place, and time.  Psychiatric:        Mood and Affect: Mood normal.        Thought Content: Thought content normal.         Judgment: Judgment normal.     Results for orders placed or performed in visit on 08/12/18  Comprehensive metabolic panel  Result Value Ref Range   Glucose 87 65 - 99 mg/dL   BUN 11 6 - 24 mg/dL   Creatinine, Ser 0.17 (H) 0.76 - 1.27 mg/dL   GFR calc non Af Amer 59 (L) >59 mL/min/1.73   GFR calc Af Amer 68 >59 mL/min/1.73   BUN/Creatinine Ratio 8 (L) 9 - 20   Sodium 143 134 - 144 mmol/L   Potassium 5.1 3.5 - 5.2 mmol/L   Chloride 101 96 - 106 mmol/L   CO2 25 20 - 29 mmol/L   Calcium 10.1 8.7 - 10.2 mg/dL   Total Protein 7.1 6.0 - 8.5 g/dL   Albumin 4.3 3.8 - 4.9 g/dL   Globulin, Total 2.8 1.5 - 4.5 g/dL   Albumin/Globulin Ratio 1.5 1.2 - 2.2   Bilirubin Total 0.8 0.0 - 1.2 mg/dL   Alkaline Phosphatase 82 39 - 117 IU/L   AST 64 (H) 0 - 40 IU/L   ALT 86 (H) 0 - 44 IU/L      Assessment & Plan:   Problem List Items Addressed This Visit      Cardiovascular and Mediastinum   Acute on chronic left systolic heart failure (HCC)    Stable, asymptomatic. Continue current regimen and recheck labs today. Referral placed to Cardiology       Relevant Orders   Ambulatory referral to Cardiology   A-fib [facility])    Stable, referral to Cardiology placed. Continue current regimen      Relevant Orders   Ambulatory referral to Cardiology   Hypertension - Primary    Stable and WNL, continue current regimen      Relevant Orders   Ambulatory referral to Cardiology   Comprehensive metabolic panel (Completed)   Dilated cardiomyopathy (HCC)    Stable, continue current regimen      Relevant Orders   Ambulatory referral to Cardiology       Follow up plan: Return in about 6 months (around 02/10/2019) for 6 month f/u.

## 2018-08-13 LAB — COMPREHENSIVE METABOLIC PANEL
ALK PHOS: 82 IU/L (ref 39–117)
ALT: 86 IU/L — AB (ref 0–44)
AST: 64 IU/L — AB (ref 0–40)
Albumin/Globulin Ratio: 1.5 (ref 1.2–2.2)
Albumin: 4.3 g/dL (ref 3.8–4.9)
BILIRUBIN TOTAL: 0.8 mg/dL (ref 0.0–1.2)
BUN/Creatinine Ratio: 8 — ABNORMAL LOW (ref 9–20)
BUN: 11 mg/dL (ref 6–24)
CHLORIDE: 101 mmol/L (ref 96–106)
CO2: 25 mmol/L (ref 20–29)
CREATININE: 1.33 mg/dL — AB (ref 0.76–1.27)
Calcium: 10.1 mg/dL (ref 8.7–10.2)
GFR calc Af Amer: 68 mL/min/{1.73_m2} (ref >59)
GFR calc non Af Amer: 59 mL/min/{1.73_m2} — ABNORMAL LOW (ref >59)
Globulin, Total: 2.8 g/dL (ref 1.5–4.5)
Glucose: 87 mg/dL (ref 65–99)
Potassium: 5.1 mmol/L (ref 3.5–5.2)
Sodium: 143 mmol/L (ref 134–144)
Total Protein: 7.1 g/dL (ref 6.0–8.5)

## 2018-08-17 NOTE — Assessment & Plan Note (Signed)
Stable, referral to Cardiology placed. Continue current regimen

## 2018-08-17 NOTE — Assessment & Plan Note (Signed)
Stable, asymptomatic. Continue current regimen and recheck labs today. Referral placed to Cardiology

## 2018-08-17 NOTE — Assessment & Plan Note (Signed)
Stable, continue current regimen 

## 2018-08-17 NOTE — Assessment & Plan Note (Signed)
Stable and WNL, continue current regimen 

## 2018-09-13 ENCOUNTER — Other Ambulatory Visit: Payer: Self-pay | Admitting: Family Medicine

## 2018-09-16 ENCOUNTER — Other Ambulatory Visit: Payer: Self-pay | Admitting: Family Medicine

## 2018-09-17 ENCOUNTER — Other Ambulatory Visit: Payer: Self-pay | Admitting: Family Medicine

## 2018-09-17 MED ORDER — LOSARTAN POTASSIUM 100 MG PO TABS: 100.0000 mg | ORAL_TABLET | Freq: Every day | ORAL | 0 refills | Status: DC

## 2018-09-17 NOTE — Telephone Encounter (Signed)
Routing to provider  

## 2018-09-17 NOTE — Telephone Encounter (Signed)
This medication has not been filled since 2017 / Will send to provider for approval

## 2018-09-17 NOTE — Telephone Encounter (Signed)
Called and spoke with pt, he states up until 2 months ago he had been regularly seeing Dr. Gerome Sam the Cardiologist who had been writing the losartan for him all along and that he's been on all these medications for years now. Something happened with his insurance or billing situation that has not allowed him to schedule back with them which is why we referred him elsewhere. States he's got an appt with the new Cardiologist we referred him to next week and will go over everything with them and have them manage his cardiac conditions going forward so everything is in one place. Do not have any notes from previous Cardiologist. Will provide 1 month of losartan to bridge him until he can establish with new Cardiologist next week. Pt to call with any issues.

## 2018-09-17 NOTE — Telephone Encounter (Signed)
Please ask the patient who's been filling this for him if he is currently taking it - I have never written it for him and his BPs are typically borderline low so I'm not comfortable writing a new script.

## 2018-09-17 NOTE — Telephone Encounter (Signed)
Called and spoke with patient, he states that he has been seeing [facility] in [place] until about 2 months ago. That he was getting his BP meds from them , and that he was told to go see Dr.Kowalski.

## 2018-09-20 ENCOUNTER — Other Ambulatory Visit: Payer: Medicaid Other

## 2018-09-20 ENCOUNTER — Other Ambulatory Visit: Payer: Self-pay

## 2018-09-20 DIAGNOSIS — R748 Abnormal levels of other serum enzymes: Secondary | ICD-10-CM

## 2018-09-20 DIAGNOSIS — R945 Abnormal results of liver function studies: Secondary | ICD-10-CM | POA: Diagnosis not present

## 2018-09-21 LAB — COMPREHENSIVE METABOLIC PANEL
A/G RATIO: 1.6 (ref 1.2–2.2)
ALT: 93 IU/L — ABNORMAL HIGH (ref 0–44)
AST: 64 IU/L — ABNORMAL HIGH (ref 0–40)
Albumin: 4.3 g/dL (ref 3.8–4.9)
Alkaline Phosphatase: 83 IU/L (ref 39–117)
BUN/Creatinine Ratio: 8 — ABNORMAL LOW (ref 9–20)
BUN: 12 mg/dL (ref 6–24)
Bilirubin Total: 0.7 mg/dL (ref 0.0–1.2)
CALCIUM: 10.2 mg/dL (ref 8.7–10.2)
CO2: 23 mmol/L (ref 20–29)
Chloride: 105 mmol/L (ref 96–106)
Creatinine, Ser: 1.5 mg/dL — ABNORMAL HIGH (ref 0.76–1.27)
GFR calc Af Amer: 58 mL/min/{1.73_m2} — ABNORMAL LOW (ref >59)
GFR, EST NON AFRICAN AMERICAN: 51 mL/min/{1.73_m2} — AB (ref >59)
Globulin, Total: 2.7 g/dL (ref 1.5–4.5)
Glucose: 85 mg/dL (ref 65–99)
Potassium: 4.9 mmol/L (ref 3.5–5.2)
Sodium: 144 mmol/L (ref 134–144)
Total Protein: 7 g/dL (ref 6.0–8.5)

## 2018-09-22 DIAGNOSIS — I10 Essential (primary) hypertension: Secondary | ICD-10-CM | POA: Diagnosis not present

## 2018-09-22 DIAGNOSIS — I420 Dilated cardiomyopathy: Secondary | ICD-10-CM | POA: Diagnosis not present

## 2018-09-22 DIAGNOSIS — R0609 Other forms of dyspnea: Secondary | ICD-10-CM | POA: Diagnosis not present

## 2018-09-22 DIAGNOSIS — I4811 Longstanding persistent atrial fibrillation: Secondary | ICD-10-CM | POA: Diagnosis not present

## 2018-09-24 ENCOUNTER — Telehealth: Payer: Self-pay | Admitting: Family Medicine

## 2018-09-24 ENCOUNTER — Other Ambulatory Visit: Payer: Self-pay | Admitting: Family Medicine

## 2018-09-24 DIAGNOSIS — R768 Other specified abnormal immunological findings in serum: Secondary | ICD-10-CM

## 2018-09-24 LAB — HEPATITIS C ANTIBODY: Hep C Virus Ab: >11 s/co ratio — ABNORMAL HIGH (ref 0.0–0.9)

## 2018-09-24 LAB — HEPATITIS B SURFACE ANTIGEN: Hepatitis B Surface Ag: NEGATIVE

## 2018-09-24 LAB — SPECIMEN STATUS REPORT

## 2018-09-24 NOTE — Telephone Encounter (Signed)
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 Will try back later but please let me know if he calls back. I have ordered an additional lab test I would like him to come get drawn when he is able and I have placed a referral to GI for further testing and treatment if needed

## 2018-09-24 NOTE — Telephone Encounter (Signed)
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 Will come Monday afternoon for a blood draw for further testing to differentiate whether past infection or new/active infection.

## 2018-09-24 NOTE — Telephone Encounter (Signed)
Please call patient back

## 2018-09-27 ENCOUNTER — Encounter: Payer: Self-pay | Admitting: *Deleted

## 2018-09-27 ENCOUNTER — Other Ambulatory Visit: Payer: Self-pay

## 2018-09-27 ENCOUNTER — Other Ambulatory Visit: Payer: Medicaid Other

## 2018-09-27 DIAGNOSIS — R768 Other specified abnormal immunological findings in serum: Secondary | ICD-10-CM

## 2018-10-02 LAB — HEPATITIS C GENOTYPE

## 2018-10-02 LAB — HCV RNA QUANT
HCV log10: 6.496 log10 IU/mL
Hepatitis C Quantitation: 3130000 IU/mL

## 2018-10-05 ENCOUNTER — Ambulatory Visit: Payer: Medicaid - Out of State | Admitting: Gastroenterology

## 2018-10-19 ENCOUNTER — Other Ambulatory Visit: Payer: Self-pay | Admitting: Family Medicine

## 2018-10-20 ENCOUNTER — Other Ambulatory Visit: Payer: Self-pay | Admitting: Family Medicine

## 2018-10-23 ENCOUNTER — Other Ambulatory Visit: Payer: Self-pay | Admitting: Family Medicine

## 2018-12-05 ENCOUNTER — Other Ambulatory Visit: Payer: Self-pay | Admitting: Family Medicine

## 2018-12-07 ENCOUNTER — Other Ambulatory Visit: Payer: Self-pay

## 2018-12-07 ENCOUNTER — Encounter (INDEPENDENT_AMBULATORY_CARE_PROVIDER_SITE_OTHER): Payer: Self-pay

## 2018-12-07 ENCOUNTER — Ambulatory Visit (INDEPENDENT_AMBULATORY_CARE_PROVIDER_SITE_OTHER): Payer: Medicaid Other | Admitting: Gastroenterology

## 2018-12-07 ENCOUNTER — Encounter: Payer: Self-pay | Admitting: Gastroenterology

## 2018-12-07 VITALS — BP 105/59 | HR 48 | Temp 98.6°F | Ht 74.0 in | Wt 269.0 lb

## 2018-12-07 DIAGNOSIS — B182 Chronic viral hepatitis C: Secondary | ICD-10-CM

## 2018-12-07 NOTE — Progress Notes (Signed)
Gastroenterology Consultation  Referring Provider:     Volney American,* Primary Care Physician:  Volney American, [place] Primary Gastroenterologist:  Dr. Allen Norris     Reason for Consultation:     Hepatitis C        HPI:   Dwayne Ramirez is a 58 y.o. y/o male referred for consultation & management of hepatitis C by Dr. Orene Desanctis, Lilia Argue, PA-C.  This patient comes in for evaluation of a hepatitis C antibody positive.  The patient has had abnormal liver enzymes dating back to 2017.  The patient had been set up for a colonoscopy through our office back in 2018 but did not go through with that and in 2019 had a Cologuard test done that was negative.  The patient is followed by cardiology for essential hypertension, dilated cardiomyopathy, longstanding persistent atrial fibrillation and dyspnea on exertion.  He is presently on Xarelto.  The patient was found to have hepatitis C genotype 1a with a viral load of 3,100,000 international units/mL. The patient reports that he never used IV drugs or get a blood transfusion before 1990.  He also denies any high risk sexual activity.  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  He denies any fatigue or shortness of breath before he had his congestive heart failure and now states that he believes that his heart failure is causing all of his fatigue and shortness of breath.  He states that he felt well before he had his cardioversion and has been deteriorating ever since.   Past Medical History:  Diagnosis Date  . A-fib ([place])   . Acute on chronic left systolic heart failure ([place]) 03/2016  . Dilated cardiomyopathy ([place])   . Hypertension   . Obesity     No past surgical history on file.  Prior to Admission medications   Medication Sig Start Date End Date Taking? Authorizing Provider  cloNIDine (CATAPRES) 0.2 MG tablet Take 1 tablet (0.2 mg total) by  mouth 2 (two) times daily. 04/02/16   Dixie Dials, MD  diltiazem (CARDIZEM) 90 MG tablet Take 1 tablet (90 mg total) by mouth 2 (two) times daily. 02/09/18   Volney American, PA-C  diltiazem (CARDIZEM) 90 MG tablet TAKE 1 TABLET BY MOUTH TWICE DAILY 02/24/18   Volney American, PA-C  furosemide (LASIX) 40 MG tablet Take 1 tablet (40 mg total) by mouth daily. 02/09/18   Volney American, PA-C  isosorbide-hydrALAZINE (BIDIL) 20-37.5 MG tablet Take 1 tablet by mouth 3 (three) times daily. 02/09/18   Volney American, PA-C  losartan (COZAAR) 100 MG tablet TAKE 1 TABLET BY MOUTH EVERY DAY 10/20/18   Volney American, PA-C  metoprolol tartrate (LOPRESSOR) 25 MG tablet TAKE 1 TABLET(25 MG) BY MOUTH TWICE DAILY 09/13/18   Volney American, PA-C  rivaroxaban (XARELTO) 20 MG TABS tablet Take 1 tablet (20 mg total) by mouth daily with supper. 04/02/16   Dixie Dials, MD  spironolactone (ALDACTONE) 25 MG tablet Take 0.5 tablets (12.5 mg total) by mouth 2 (two) times daily. 02/09/18   Volney American, PA-C  XARELTO 15 MG TABS tablet TAKE 1 TABLET BY MOUTH DAILY 12/06/18   Volney American, PA-C    Family History  Problem Relation Age of Onset  . Hypertension Mother   . Hypertension Father   . Cancer Neg Hx   . Diabetes Neg  Hx   . COPD Neg Hx   . Heart disease Neg Hx   . Stroke Neg Hx      Social History   Tobacco Use  . Smoking status: Never Smoker  . Smokeless tobacco: Never Used  Substance Use Topics  . Alcohol use: No  . Drug use: No    Allergies as of 12/07/2018  . (No Known Allergies)    Review of Systems:    All systems reviewed and negative except where noted in HPI.   Physical Exam:  There were no vitals taken for this visit. No LMP for male patient. General:   Alert,  Well-developed, well-nourished, pleasant and cooperative in NAD Head:  Normocephalic and atraumatic. Eyes:  Sclera clear, no icterus.   Conjunctiva pink. Ears:   Normal auditory acuity. Nose:  No deformity, discharge, or lesions. Mouth:  No deformity or lesions,oropharynx pink & moist. Neck:  Supple; no masses or thyromegaly. Lungs:  Respirations even and unlabored.  Clear throughout to auscultation.   No wheezes, crackles, or rhonchi. No acute distress. Heart:  Regular rate and rhythm; no murmurs, clicks, rubs, or gallops. Abdomen:  Normal bowel sounds.  No bruits.  Soft, non-tender and non-distended without masses, hepatosplenomegaly or hernias noted.  No guarding or rebound tenderness.  Negative Carnett sign.   Rectal:  Deferred.  Msk:  Symmetrical without gross deformities.  Good, equal movement & strength bilaterally. Pulses:  Normal pulses noted. Extremities:  No clubbing or edema.  No cyanosis. Neurologic:  Alert and oriented x3;  grossly normal neurologically. Skin:  Intact without significant lesions or rashes.  No jaundice. Lymph Nodes:  No significant cervical adenopathy. Psych:  Alert and cooperative. Normal mood and affect.  Imaging Studies: No results found.  Assessment and Plan:   Georffrey Fomby is a 58 y.o. y/o male who comes in with a history of being found to have a hepatitis C antibody positive with genotype 1a and a viral load of 3,100,000 international units/mL.  The patient will have his lab sent off for other possible cause of abnormal liver enzymes.  The patient will also have a scan to see if he has advanced fibrosis of his liver.  The patient has been told that he will also have blood work to see if he is immune to hepatitis A and hepatitis B.  The patient has been told that if it is negative he will need vaccinations for these.  The patient has also been told that when his labs come back we will then determine which is the best treatment for him and its duration.  The patient has been explained the plan and agrees with it.  Midge Minium, MD. Clementeen Graham    Note: This dictation was prepared with Dragon dictation along with smaller  phrase technology. Any transcriptional errors that result from this process are unintentional.

## 2018-12-08 LAB — HCV FIBROSURE

## 2018-12-09 ENCOUNTER — Other Ambulatory Visit: Payer: Self-pay | Admitting: Family Medicine

## 2018-12-09 DIAGNOSIS — I420 Dilated cardiomyopathy: Secondary | ICD-10-CM

## 2018-12-09 DIAGNOSIS — I4891 Unspecified atrial fibrillation: Secondary | ICD-10-CM

## 2018-12-09 LAB — HCV FIBROSURE
ALPHA 2-MACROGLOBULINS, QN: 301 mg/dL — ABNORMAL HIGH (ref 110–276)
ALT (SGPT) P5P: 91 IU/L — ABNORMAL HIGH (ref 0–55)
Apolipoprotein A-1: 124 mg/dL (ref 101–178)
Bilirubin, Total: 0.4 mg/dL (ref 0.0–1.2)
Fibrosis Score: 0.55 — ABNORMAL HIGH (ref 0.00–0.21)
GGT: 62 IU/L (ref 0–65)
Haptoglobin: 128 mg/dL (ref 29–370)
Necroinflammat Activity Score: 0.62 — ABNORMAL HIGH (ref 0.00–0.17)

## 2018-12-09 LAB — HEPATIC FUNCTION PANEL
ALT: 79 IU/L — ABNORMAL HIGH (ref 0–44)
AST: 59 IU/L — ABNORMAL HIGH (ref 0–40)
Albumin: 4.1 g/dL (ref 3.8–4.9)
Alkaline Phosphatase: 88 IU/L (ref 39–117)
Bilirubin Total: 0.6 mg/dL (ref 0.0–1.2)
Bilirubin, Direct: 0.25 mg/dL (ref 0.00–0.40)
Total Protein: 6.8 g/dL (ref 6.0–8.5)

## 2018-12-09 LAB — IRON AND TIBC
Iron Saturation: 60 % — ABNORMAL HIGH (ref 15–55)
Iron: 187 ug/dL — ABNORMAL HIGH (ref 38–169)
Total Iron Binding Capacity: 314 ug/dL (ref 250–450)
UIBC: 127 ug/dL (ref 111–343)

## 2018-12-09 LAB — MITOCHONDRIAL ANTIBODIES: Mitochondrial Ab: <20 Units (ref 0.0–20.0)

## 2018-12-09 LAB — ANA: Anti Nuclear Antibody (ANA): NEGATIVE

## 2018-12-09 LAB — ANTI-SMOOTH MUSCLE ANTIBODY, IGG: Smooth Muscle Ab: 42 Units — ABNORMAL HIGH (ref 0–19)

## 2018-12-09 LAB — ALPHA-1-ANTITRYPSIN: A-1 Antitrypsin: 126 mg/dL (ref 101–187)

## 2018-12-09 LAB — HEPATITIS A ANTIBODY, TOTAL: hep A Total Ab: POSITIVE — AB

## 2018-12-09 LAB — HEPATITIS B SURFACE ANTIBODY,QUALITATIVE: Hep B Surface Ab, Qual: REACTIVE

## 2018-12-09 LAB — CERULOPLASMIN: Ceruloplasmin: 24.3 mg/dL (ref 16.0–31.0)

## 2018-12-09 LAB — FERRITIN: Ferritin: 673 ng/mL — ABNORMAL HIGH (ref 30–400)

## 2018-12-16 ENCOUNTER — Other Ambulatory Visit: Payer: Self-pay | Admitting: Family Medicine

## 2018-12-22 ENCOUNTER — Telehealth: Payer: Self-pay

## 2018-12-22 NOTE — Telephone Encounter (Signed)
-----   Message from Lucilla Lame, MD sent at 12/21/2018 10:37 AM EDT ----- He does not need any vaccinations but needs treatment for his HCV. His fibersure showed F2.

## 2018-12-22 NOTE — Telephone Encounter (Signed)
Pt notified of lab results. Paperwork completed and faxed to [facility].  

## 2019-01-04 ENCOUNTER — Other Ambulatory Visit: Payer: Self-pay | Admitting: Family Medicine

## 2019-01-05 NOTE — Telephone Encounter (Signed)
Requested Prescriptions  Pending Prescriptions Disp Refills  . XARELTO 15 MG TABS tablet [Pharmacy Med Name: XARELTO 15MG  TABLETS] 30 tablet 0    Sig: TAKE 1 TABLET BY MOUTH DAILY     Hematology: Anticoagulants - rivaroxaban Failed - 01/04/2019  7:30 PM      Failed - ALT in normal range and within 180 days    ALT  Date Value Ref Range Status  12/07/2018 79 (H) 0 - 44 IU/L Final         Failed - AST in normal range and within 180 days    AST  Date Value Ref Range Status  12/07/2018 59 (H) 0 - 40 IU/L Final         Failed - Cr in normal range and within 360 days    Creatinine, Ser  Date Value Ref Range Status  09/20/2018 1.50 (H) 0.76 - 1.27 mg/dL Final         Passed - HCT in normal range and within 360 days    Hematocrit  Date Value Ref Range Status  02/09/2018 46.4 37.5 - 51.0 % Final         Passed - HGB in normal range and within 360 days    Hemoglobin  Date Value Ref Range Status  02/09/2018 16.0 13.0 - 17.7 g/dL Final         Passed - PLT in normal range and within 360 days    Platelets  Date Value Ref Range Status  02/09/2018 236 150 - 450 x10E3/uL Final         Passed - Valid encounter within last 12 months    Recent Outpatient Visits          4 months ago Essential hypertension   [facility] Volney American, [place]   11 months ago Essential hypertension   [facility] Volney American, [place]   1 year ago Essential hypertension   [facility] Volney American, [place]   1 year ago Annual physical exam   [facility] Volney American, [place]   2 years ago Encounter to establish care   [facility], Lilia Argue, [place]      Future Appointments            In 1 month Volney American, [place] [facility], [place]           Last refill 12/06/18 #30 no refills  Was being referred to cardiology no appointment noted yet

## 2019-01-05 NOTE — Telephone Encounter (Signed)
Requested medication (s) are due for refill today:  Yes  Requested medication (s) are on the active medication list:  yes  Future visit scheduled:  yes  Last Refill: 12/06/18; #30; no refills Sending back to PCP, as pt. Was referred to Cardiology, but no noted appt. Yet.   Requested Prescriptions  Pending Prescriptions Disp Refills   XARELTO 15 MG TABS tablet [Pharmacy Med Name: XARELTO 15MG  TABLETS] 30 tablet 0    Sig: TAKE 1 TABLET BY MOUTH DAILY     Hematology: Anticoagulants - rivaroxaban Failed - 01/04/2019  7:30 PM      Failed - ALT in normal range and within 180 days    ALT  Date Value Ref Range Status  12/07/2018 79 (H) 0 - 44 IU/L Final         Failed - AST in normal range and within 180 days    AST  Date Value Ref Range Status  12/07/2018 59 (H) 0 - 40 IU/L Final         Failed - Cr in normal range and within 360 days    Creatinine, Ser  Date Value Ref Range Status  09/20/2018 1.50 (H) 0.76 - 1.27 mg/dL Final         Passed - HCT in normal range and within 360 days    Hematocrit  Date Value Ref Range Status  02/09/2018 46.4 37.5 - 51.0 % Final         Passed - HGB in normal range and within 360 days    Hemoglobin  Date Value Ref Range Status  02/09/2018 16.0 13.0 - 17.7 g/dL Final         Passed - PLT in normal range and within 360 days    Platelets  Date Value Ref Range Status  02/09/2018 236 150 - 450 x10E3/uL Final         Passed - Valid encounter within last 12 months    Recent Outpatient Visits          4 months ago Essential hypertension   [facility] Volney American, [place]   11 months ago Essential hypertension   [place], Lilia Argue, [place]   1 year ago Essential hypertension   [facility] Volney American, [place]   1 year ago Annual physical exam   [facility] Volney American, [place]   2 years ago Encounter to establish care   [facility],  Lilia Argue, [place]      Future Appointments            In 1 month Orene Desanctis, Lilia Argue, [place], [facility]

## 2019-01-06 NOTE — Telephone Encounter (Signed)
Has a Cardiologist now that manages his atrial fibrillation

## 2019-01-07 ENCOUNTER — Other Ambulatory Visit: Payer: Self-pay

## 2019-01-07 NOTE — Telephone Encounter (Signed)
Prescription refill request on Xarelto 15 mg tablets

## 2019-01-10 MED ORDER — RIVAROXABAN 15 MG PO TABS: 15.0000 mg | ORAL_TABLET | Freq: Every day | ORAL | 0 refills | Status: DC

## 2019-01-10 NOTE — Telephone Encounter (Signed)
Called pt, no answer. Called to inform pt that I spoke with cardiology and they have sent the refill request to the provider. [place] [facility] will call me back if there are any issues with refilling this medication.

## 2019-01-11 NOTE — Telephone Encounter (Signed)
Routing to close.  °

## 2019-02-09 ENCOUNTER — Other Ambulatory Visit: Payer: Self-pay | Admitting: Family Medicine

## 2019-02-09 NOTE — Telephone Encounter (Signed)
Forwarding medication refill to PCP for review. 

## 2019-02-10 ENCOUNTER — Ambulatory Visit: Payer: Medicaid Other | Admitting: Family Medicine

## 2019-02-14 ENCOUNTER — Telehealth: Payer: Self-pay | Admitting: Gastroenterology

## 2019-02-14 ENCOUNTER — Other Ambulatory Visit: Payer: Self-pay | Admitting: Gastroenterology

## 2019-02-14 NOTE — Telephone Encounter (Signed)
Called pt to inform him of the message from Ginger we do not prescribe rx Xarelto he states his PCP referred him to [place] to obtain this medication he was under the assumption we are heart care I explained him the referral to [place] was for Hep C and that his PCP should reffer him to a cardiologist if he had heart issues. Pt was frustrated but aware to contact his PCP for more information on rx Xarelto

## 2019-02-14 NOTE — Telephone Encounter (Signed)
Pt is calling he needs authorization for his rx Serelto  To [facility] on [address] in [place]  Please call pt

## 2019-02-14 NOTE — Telephone Encounter (Signed)
Please call pt back and let him know we do prescribe or authorize Xarelto. That's a blood thinner managed by either a PCP or cardiology.

## 2019-02-15 ENCOUNTER — Telehealth: Payer: Self-pay | Admitting: Family Medicine

## 2019-02-15 MED ORDER — RIVAROXABAN 15 MG PO TABS: 15.0000 mg | ORAL_TABLET | Freq: Every day | ORAL | 0 refills | Status: DC

## 2019-02-15 NOTE — Telephone Encounter (Signed)
Called pt to let him know that Apolonio Schneiders is refilling enough for 1 month and that he needs to contact his cardiologists to refill further, no answer, vm not set up

## 2019-02-15 NOTE — Telephone Encounter (Signed)
On 01/04/19 I spoke with the pt and let him know that this medication was to come from the cardiologist. Has this medication been taken over by PCP or would he need to call cardio?

## 2019-02-15 NOTE — Telephone Encounter (Signed)
Pt called to get the status of hi refill request for Rivaroxaban (XARELTO) 15 MG TABS tablet  Pt ran out of this medication last Thursday and is in need of a refill asap. Pt stated he was advised that it was denied by [address] Schne[address] and he is confused as to why. He stated that he has been on this medication for years and has been getting refills from his PCP. He has not spoken with a cardiologist for any medication. Pt would like a call from rachel of the nurse to explain what he needs to do to get this refill asap / please advise

## 2019-02-15 NOTE — Telephone Encounter (Signed)
I had been writing it because he was between Cardiologists the past few years and there were multiple mix-ups there but now he is established with one and typically they manage this medication if they want him to stay on it. I will bridge him a month but as I've said in previous refill denials he needs to talk with them about this.

## 2019-02-18 ENCOUNTER — Ambulatory Visit (INDEPENDENT_AMBULATORY_CARE_PROVIDER_SITE_OTHER): Payer: Medicaid Other | Admitting: Family Medicine

## 2019-02-18 ENCOUNTER — Other Ambulatory Visit: Payer: Self-pay

## 2019-02-18 ENCOUNTER — Encounter: Payer: Self-pay | Admitting: Family Medicine

## 2019-02-18 VITALS — BP 125/80 | HR 62 | Temp 98.6°F | Ht 74.0 in | Wt 268.0 lb

## 2019-02-18 DIAGNOSIS — B182 Chronic viral hepatitis C: Secondary | ICD-10-CM | POA: Diagnosis not present

## 2019-02-18 DIAGNOSIS — I10 Essential (primary) hypertension: Secondary | ICD-10-CM | POA: Diagnosis not present

## 2019-02-18 DIAGNOSIS — I5023 Acute on chronic systolic (congestive) heart failure: Secondary | ICD-10-CM

## 2019-02-18 DIAGNOSIS — I420 Dilated cardiomyopathy: Secondary | ICD-10-CM

## 2019-02-18 DIAGNOSIS — I4891 Unspecified atrial fibrillation: Secondary | ICD-10-CM | POA: Diagnosis not present

## 2019-02-18 NOTE — Progress Notes (Signed)
BP 125/80   Pulse 62   Temp 98.6 F (37 C) (Oral)   Ht 6\' 2"  (1.88 m)   Wt 268 lb (121.6 kg)   SpO2 95%   BMI 34.41 kg/m    Subjective:    Patient ID: Dwayne Ramirez, male    DOB: 08-Dec-1960, 58 y.o.   MRN: [id number]  HPI: Dwayne Ramirez is a 58 y.o. male  Chief Complaint  Patient presents with  . Follow-up    32m  . Hypertension   Patient presenting today for 6 month f/u chronic conditions.   Followed by Cardiology for HTN, atrial fibrillation, DCM, and systolic HF. Still having some constant underlying SOB for which Cardiology has ordered an echocardiogram but patient states he never got it. Otherwise, he has no new concerns and states overall he feels well. Takes his medicines faithfully without side effects. Denies CP, dizziness, syncope, bleeding or bruising issues.   Followed by GI for chronic hepatitis C, currently undergoing oral outpatient treatment which he is tolerating well.   Relevant past medical, surgical, family and social history reviewed and updated as indicated. Interim medical history since our last visit reviewed. Allergies and medications reviewed and updated.  Review of Systems  Per HPI unless specifically indicated above     Objective:    BP 125/80   Pulse 62   Temp 98.6 F (37 C) (Oral)   Ht 6\' 2"  (1.88 m)   Wt 268 lb (121.6 kg)   SpO2 95%   BMI 34.41 kg/m   Wt Readings from Last 3 Encounters:  02/18/19 268 lb (121.6 kg)  12/07/18 269 lb (122 kg)  08/12/18 273 lb 4 oz (123.9 kg)    Physical Exam Vitals signs and nursing note reviewed.  Constitutional:      Appearance: Normal appearance.  HENT:     Head: Atraumatic.  Eyes:     Extraocular Movements: Extraocular movements intact.     Conjunctiva/sclera: Conjunctivae normal.  Neck:     Musculoskeletal: Normal range of motion and neck supple.  Cardiovascular:     Rate and Rhythm: Normal rate.  Pulmonary:     Effort: Pulmonary effort is normal.     Breath sounds: Normal breath  sounds.  Musculoskeletal: Normal range of motion.  Skin:    General: Skin is warm and dry.  Neurological:     General: No focal deficit present.     Mental Status: He is oriented to person, place, and time.  Psychiatric:        Mood and Affect: Mood normal.        Thought Content: Thought content normal.        Judgment: Judgment normal.     Results for orders placed or performed in visit on 29/52/84  Basic metabolic panel  Result Value Ref Range   Glucose 86 65 - 99 mg/dL   BUN 16 6 - 24 mg/dL   Creatinine, Ser 1.53 (H) 0.76 - 1.27 mg/dL   GFR calc non Af Amer 49 (L) >59 mL/min/1.73   GFR calc Af Amer 57 (L) >59 mL/min/1.73   BUN/Creatinine Ratio 10 9 - 20   Sodium 140 134 - 144 mmol/L   Potassium 4.5 3.5 - 5.2 mmol/L   Chloride 105 96 - 106 mmol/L   CO2 21 20 - 29 mmol/L   Calcium 9.6 8.7 - 10.2 mg/dL      Assessment & Plan:   Problem List Items Addressed This Visit  Cardiovascular and Mediastinum   Acute on chronic left systolic heart failure (HCC)    Followed by Cardiology. Continue current regimen per their recommendations. Recommended he reach out to the clinic to see about scheduling the testing they have requested      Relevant Orders   Basic metabolic panel (Completed)   A-fib (HCC)    Under good control, continue current regimen      Relevant Orders   Basic metabolic panel (Completed)   Hypertension - Primary    Stable and WNL, continue current regimen. Followed by Cardiology      Relevant Orders   Basic metabolic panel (Completed)   Dilated cardiomyopathy (HCC)   Relevant Orders   Basic metabolic panel (Completed)     Digestive   Hepatitis C, chronic (HCC)    Currently being followed and treated through GI. Continue per their recommendations          Follow up plan: Return in about 6 months (around 08/21/2019) for CPE.

## 2019-02-19 LAB — BASIC METABOLIC PANEL
BUN/Creatinine Ratio: 10 (ref 9–20)
BUN: 16 mg/dL (ref 6–24)
CO2: 21 mmol/L (ref 20–29)
Calcium: 9.6 mg/dL (ref 8.7–10.2)
Chloride: 105 mmol/L (ref 96–106)
Creatinine, Ser: 1.53 mg/dL — ABNORMAL HIGH (ref 0.76–1.27)
GFR calc Af Amer: 57 mL/min/{1.73_m2} — ABNORMAL LOW (ref >59)
GFR calc non Af Amer: 49 mL/min/{1.73_m2} — ABNORMAL LOW (ref >59)
Glucose: 86 mg/dL (ref 65–99)
Potassium: 4.5 mmol/L (ref 3.5–5.2)
Sodium: 140 mmol/L (ref 134–144)

## 2019-02-21 ENCOUNTER — Encounter: Payer: Self-pay | Admitting: Family Medicine

## 2019-02-21 DIAGNOSIS — B182 Chronic viral hepatitis C: Secondary | ICD-10-CM | POA: Insufficient documentation

## 2019-02-21 NOTE — Assessment & Plan Note (Signed)
Stable and WNL, continue current regimen. Followed by Cardiology

## 2019-02-21 NOTE — Assessment & Plan Note (Signed)
Currently being followed and treated through GI. Continue per their recommendations

## 2019-02-21 NOTE — Assessment & Plan Note (Addendum)
Followed by Cardiology. Continue current regimen per their recommendations. Recommended he reach out to the clinic to see about scheduling the testing they have requested

## 2019-02-21 NOTE — Assessment & Plan Note (Signed)
Under good control, continue current regimen 

## 2019-03-02 ENCOUNTER — Other Ambulatory Visit: Payer: Self-pay

## 2019-03-02 NOTE — Telephone Encounter (Signed)
[facility] faxed a Rx refill request on Furosemide 40 mg tablets

## 2019-03-03 MED ORDER — FUROSEMIDE 40 MG PO TABS: 40.0000 mg | ORAL_TABLET | Freq: Every day | ORAL | 1 refills | Status: DC

## 2019-03-15 DIAGNOSIS — R0600 Dyspnea, unspecified: Secondary | ICD-10-CM | POA: Diagnosis not present

## 2019-03-15 DIAGNOSIS — I420 Dilated cardiomyopathy: Secondary | ICD-10-CM | POA: Diagnosis not present

## 2019-03-15 DIAGNOSIS — I4811 Longstanding persistent atrial fibrillation: Secondary | ICD-10-CM | POA: Diagnosis not present

## 2019-03-22 ENCOUNTER — Other Ambulatory Visit: Payer: Self-pay | Admitting: Family Medicine

## 2019-03-23 DIAGNOSIS — I340 Nonrheumatic mitral (valve) insufficiency: Secondary | ICD-10-CM | POA: Diagnosis not present

## 2019-03-23 DIAGNOSIS — I071 Rheumatic tricuspid insufficiency: Secondary | ICD-10-CM | POA: Diagnosis not present

## 2019-03-23 DIAGNOSIS — I4811 Longstanding persistent atrial fibrillation: Secondary | ICD-10-CM | POA: Diagnosis not present

## 2019-03-23 DIAGNOSIS — I420 Dilated cardiomyopathy: Secondary | ICD-10-CM | POA: Diagnosis not present

## 2019-03-30 ENCOUNTER — Other Ambulatory Visit: Payer: Self-pay | Admitting: Family Medicine

## 2019-04-18 DIAGNOSIS — I340 Nonrheumatic mitral (valve) insufficiency: Secondary | ICD-10-CM | POA: Diagnosis not present

## 2019-04-18 DIAGNOSIS — I071 Rheumatic tricuspid insufficiency: Secondary | ICD-10-CM | POA: Diagnosis not present

## 2019-05-02 ENCOUNTER — Other Ambulatory Visit: Payer: Self-pay

## 2019-05-02 DIAGNOSIS — Z20822 Contact with and (suspected) exposure to covid-19: Secondary | ICD-10-CM

## 2019-05-03 LAB — NOVEL CORONAVIRUS, NAA: SARS-CoV-2, NAA: NOT DETECTED

## 2019-05-04 ENCOUNTER — Telehealth: Payer: Self-pay | Admitting: Family Medicine

## 2019-05-04 NOTE — Telephone Encounter (Signed)
Negative COVID results given. Patient results "NOT Detected." Caller expressed understanding. ° °

## 2019-05-27 DIAGNOSIS — I10 Essential (primary) hypertension: Secondary | ICD-10-CM | POA: Diagnosis not present

## 2019-05-27 DIAGNOSIS — I4811 Longstanding persistent atrial fibrillation: Secondary | ICD-10-CM | POA: Diagnosis not present

## 2019-05-27 DIAGNOSIS — I420 Dilated cardiomyopathy: Secondary | ICD-10-CM | POA: Diagnosis not present

## 2019-05-27 DIAGNOSIS — I071 Rheumatic tricuspid insufficiency: Secondary | ICD-10-CM | POA: Diagnosis not present

## 2019-06-27 ENCOUNTER — Other Ambulatory Visit: Payer: Self-pay | Admitting: Family Medicine

## 2019-08-19 ENCOUNTER — Other Ambulatory Visit: Payer: Self-pay

## 2019-08-19 ENCOUNTER — Encounter: Payer: Self-pay | Admitting: Family Medicine

## 2019-08-19 ENCOUNTER — Ambulatory Visit (INDEPENDENT_AMBULATORY_CARE_PROVIDER_SITE_OTHER): Payer: Medicaid Other | Admitting: Family Medicine

## 2019-08-19 VITALS — BP 138/96 | HR 80 | Temp 98.4°F | Ht 74.5 in | Wt 274.0 lb

## 2019-08-19 DIAGNOSIS — I10 Essential (primary) hypertension: Secondary | ICD-10-CM | POA: Diagnosis not present

## 2019-08-19 DIAGNOSIS — I4891 Unspecified atrial fibrillation: Secondary | ICD-10-CM | POA: Diagnosis not present

## 2019-08-19 DIAGNOSIS — I5023 Acute on chronic systolic (congestive) heart failure: Secondary | ICD-10-CM

## 2019-08-19 DIAGNOSIS — I420 Dilated cardiomyopathy: Secondary | ICD-10-CM

## 2019-08-19 DIAGNOSIS — Z0000 Encounter for general adult medical examination without abnormal findings: Secondary | ICD-10-CM

## 2019-08-19 DIAGNOSIS — B182 Chronic viral hepatitis C: Secondary | ICD-10-CM

## 2019-08-19 DIAGNOSIS — Z13220 Encounter for screening for lipoid disorders: Secondary | ICD-10-CM | POA: Diagnosis not present

## 2019-08-19 LAB — UA/M W/RFLX CULTURE, ROUTINE
Bilirubin, UA: NEGATIVE
Glucose, UA: NEGATIVE
Ketones, UA: NEGATIVE
Leukocytes,UA: NEGATIVE
Nitrite, UA: NEGATIVE
RBC, UA: NEGATIVE
Specific Gravity, UA: >1.03 — ABNORMAL HIGH (ref 1.005–1.030)
Urobilinogen, Ur: 1 mg/dL (ref 0.2–1.0)
pH, UA: 5 (ref 5.0–7.5)

## 2019-08-19 LAB — MICROSCOPIC EXAMINATION
Bacteria, UA: NONE SEEN
RBC, Urine: NONE SEEN /hpf (ref 0–2)

## 2019-08-19 MED ORDER — RIVAROXABAN 15 MG PO TABS: 15.0000 mg | ORAL_TABLET | Freq: Every day | ORAL | 1 refills | Status: DC

## 2019-08-19 MED ORDER — SPIRONOLACTONE 25 MG PO TABS: 12.5000 mg | ORAL_TABLET | Freq: Two times a day (BID) | ORAL | 1 refills | Status: DC

## 2019-08-19 MED ORDER — METOPROLOL TARTRATE 25 MG PO TABS: 25.0000 mg | ORAL_TABLET | Freq: Two times a day (BID) | ORAL | 1 refills | Status: DC

## 2019-08-19 MED ORDER — FUROSEMIDE 40 MG PO TABS: 40.0000 mg | ORAL_TABLET | Freq: Every day | ORAL | 1 refills | Status: DC

## 2019-08-19 MED ORDER — LOSARTAN POTASSIUM 100 MG PO TABS: 100.0000 mg | ORAL_TABLET | Freq: Every day | ORAL | 1 refills | Status: DC

## 2019-08-19 NOTE — Assessment & Plan Note (Signed)
Followed by Cardiology 

## 2019-08-19 NOTE — Assessment & Plan Note (Addendum)
Followed by Cardiology, stable and well controlled. Continue current regimen and await labs

## 2019-08-19 NOTE — Progress Notes (Signed)
BP (!) 138/96   Pulse 80   Temp 98.4 F (36.9 C) (Oral)   Ht 6' 2.5" (1.892 m)   Wt 274 lb (124.3 kg)   SpO2 95%   BMI 34.71 kg/m    Subjective:    Patient ID: Keyshun Elpers, male    DOB: 1960/11/07, 59 y.o.   MRN: [id number]  HPI: Nathanal Hermiz is a 60 y.o. male presenting on 08/19/2019 for comprehensive medical examination. Current medical complaints include:none  Cardiac issues, including HTN, CHF, atrial fib, and DCM followed by Cardiology. Only recent change was d/c of cardizem which has been well tolerated. Home BPs running 130s/80s range.   He currently lives with: Interim Problems from his last visit: no  Depression Screen done today and results listed below:  Depression screen [facility] 2/9 08/19/2019 02/09/2018 01/15/2017  Decreased Interest 3 3 0  Down, Depressed, Hopeless 0 0 0  PHQ - 2 Score 3 3 0  Altered sleeping 0 3 -  Tired, decreased energy 3 3 -  Change in appetite 0 3 -  Feeling bad or failure about yourself  0 0 -  Trouble concentrating 0 0 -  Moving slowly or fidgety/restless 0 0 -  Suicidal thoughts 0 0 -  PHQ-9 Score 6 12 -  Difficult doing work/chores - Somewhat difficult -    The patient does not have a history of falls. I did complete a risk assessment for falls. A plan of care for falls was documented.   Past Medical History:  Past Medical History:  Diagnosis Date  . A-fib (HCC)   . Acute on chronic left systolic heart failure (HCC) 03/2016  . Dilated cardiomyopathy (HCC)   . Hypertension   . Obesity     Surgical History:  History reviewed. No pertinent surgical history.  Medications:  Current Outpatient Medications on File Prior to Visit  Medication Sig  . BIDIL 20-37.5 MG tablet TAKE 1 TABLET BY MOUTH THREE TIMES DAILY  . cloNIDine (CATAPRES) 0.2 MG tablet Take 1 tablet (0.2 mg total) by mouth 2 (two) times daily.   No current facility-administered medications on file prior to visit.    Allergies:  No Known Allergies  Social  History:  Social History   Socioeconomic History  . Marital status: Single    Spouse name: Not on file  . Number of children: Not on file  . Years of education: Not on file  . Highest education level: Not on file  Occupational History  . Not on file  Tobacco Use  . Smoking status: Never Smoker  . Smokeless tobacco: Never Used  Substance and Sexual Activity  . Alcohol use: No  . Drug use: No  . Sexual activity: Not on file  Other Topics Concern  . Not on file  Social History Narrative  . Not on file   Social Determinants of Health   Financial Resource Strain:   . Difficulty of Paying Living Expenses: Not on file  Food Insecurity:   . Worried About Programme researcher, broadcasting/film/video in the Last Year: Not on file  . Ran Out of Food in the Last Year: Not on file  Transportation Needs:   . Lack of Transportation (Medical): Not on file  . Lack of Transportation (Non-Medical): Not on file  Physical Activity:   . Days of Exercise per Week: Not on file  . Minutes of Exercise per Session: Not on file  Stress:   . Feeling of Stress : Not on file  Social Connections:   . Frequency of Communication with Friends and Family: Not on file  . Frequency of Social Gatherings with Friends and Family: Not on file  . Attends Religious Services: Not on file  . Active Member of Clubs or Organizations: Not on file  . Attends Archivist Meetings: Not on file  . Marital Status: Not on file  Intimate Partner Violence:   . Fear of Current or Ex-Partner: Not on file  . Emotionally Abused: Not on file  . Physically Abused: Not on file  . Sexually Abused: Not on file   Social History   Tobacco Use  Smoking Status Never Smoker  Smokeless Tobacco Never Used   Social History   Substance and Sexual Activity  Alcohol Use No    Family History:  Family History  Problem Relation Age of Onset  . Hypertension Mother   . Hypertension Father   . Cancer Neg Hx   . Diabetes Neg Hx   . COPD Neg Hx     . Heart disease Neg Hx   . Stroke Neg Hx     Past medical history, surgical history, medications, allergies, family history and social history reviewed with patient today and changes made to appropriate areas of the chart.   Review of Systems - General ROS: negative Psychological ROS: negative Ophthalmic ROS: negative ENT ROS: negative Allergy and Immunology ROS: negative Hematological and Lymphatic ROS: negative Endocrine ROS: negative Breast ROS: negative for breast lumps Respiratory ROS: no cough, shortness of breath, or wheezing Cardiovascular ROS: no chest pain or dyspnea on exertion Gastrointestinal ROS: no abdominal pain, change in bowel habits, or black or bloody stools Genito-Urinary ROS: no dysuria, trouble voiding, or hematuria Musculoskeletal ROS: negative Neurological ROS: no TIA or stroke symptoms Dermatological ROS: negative All other ROS negative except what is listed above and in the HPI.      Objective:    BP (!) 138/96   Pulse 80   Temp 98.4 F (36.9 C) (Oral)   Ht 6' 2.5" (1.892 m)   Wt 274 lb (124.3 kg)   SpO2 95%   BMI 34.71 kg/m   Wt Readings from Last 3 Encounters:  08/19/19 274 lb (124.3 kg)  02/18/19 268 lb (121.6 kg)  12/07/18 269 lb (122 kg)    Physical Exam Vitals and nursing note reviewed.  Constitutional:      General: He is not in acute distress.    Appearance: He is well-developed.  HENT:     Head: Atraumatic.     Right Ear: Tympanic membrane and external ear normal.     Left Ear: Tympanic membrane and external ear normal.     Nose: Nose normal.     Mouth/Throat:     Mouth: Mucous membranes are moist.     Pharynx: Oropharynx is clear.  Eyes:     General: No scleral icterus.    Conjunctiva/sclera: Conjunctivae normal.     Pupils: Pupils are equal, round, and reactive to light.  Cardiovascular:     Rate and Rhythm: Normal rate and regular rhythm.     Heart sounds: Normal heart sounds. No murmur.  Pulmonary:     Effort:  Pulmonary effort is normal. No respiratory distress.     Breath sounds: Normal breath sounds.  Abdominal:     General: Bowel sounds are normal. There is no distension.     Palpations: Abdomen is soft. There is no mass.     Tenderness: There is no abdominal tenderness.  There is no guarding.  Genitourinary:    Comments: GU exam declined Musculoskeletal:        General: No tenderness. Normal range of motion.     Cervical back: Normal range of motion and neck supple.  Skin:    General: Skin is warm and dry.     Findings: No rash.  Neurological:     General: No focal deficit present.     Mental Status: He is alert and oriented to person, place, and time.     Deep Tendon Reflexes: Reflexes are normal and symmetric.  Psychiatric:        Mood and Affect: Mood normal.        Behavior: Behavior normal.        Thought Content: Thought content normal.        Judgment: Judgment normal.     Results for orders placed or performed in visit on 05/02/19  Novel Coronavirus, NAA ([facility])   Specimen: Nasopharyngeal(NP) swabs in vial transport medium   NASOPHARYNGE  TESTING  Result Value Ref Range   SARS-CoV-2, NAA Not Detected Not Detected      Assessment & Plan:   Problem List Items Addressed This Visit      Cardiovascular and Mediastinum   Acute on chronic left systolic heart failure (HCC) - Primary    Followed by Cardiology      Relevant Medications   furosemide (LASIX) 40 MG tablet   losartan (COZAAR) 100 MG tablet   metoprolol tartrate (LOPRESSOR) 25 MG tablet   Rivaroxaban (XARELTO) 15 MG TABS tablet   spironolactone (ALDACTONE) 25 MG tablet   A-fib (HCC)    Followed by Cardiology      Relevant Medications   furosemide (LASIX) 40 MG tablet   losartan (COZAAR) 100 MG tablet   metoprolol tartrate (LOPRESSOR) 25 MG tablet   Rivaroxaban (XARELTO) 15 MG TABS tablet   spironolactone (ALDACTONE) 25 MG tablet   Hypertension    Followed by Cardiology, stable and well controlled.  Continue current regimen and await labs      Relevant Medications   furosemide (LASIX) 40 MG tablet   losartan (COZAAR) 100 MG tablet   metoprolol tartrate (LOPRESSOR) 25 MG tablet   Rivaroxaban (XARELTO) 15 MG TABS tablet   spironolactone (ALDACTONE) 25 MG tablet   Other Relevant Orders   CBC with Differential/Platelet   Comprehensive metabolic panel   UA/M w/rflx Culture, Routine   Dilated cardiomyopathy (HCC)    Followed by Cardiology      Relevant Medications   furosemide (LASIX) 40 MG tablet   losartan (COZAAR) 100 MG tablet   metoprolol tartrate (LOPRESSOR) 25 MG tablet   Rivaroxaban (XARELTO) 15 MG TABS tablet   spironolactone (ALDACTONE) 25 MG tablet     Digestive   Hepatitis C, chronic (HCC)    Treated through GI in 2020.        Other Visit Diagnoses    Annual physical exam       Screening for hyperlipidemia       Relevant Orders   Lipid Panel w/o Chol/HDL Ratio       Discussed aspirin prophylaxis for myocardial infarction prevention and decision was it was not indicated  LABORATORY TESTING:  Health maintenance labs ordered today as discussed above.   The natural history of prostate cancer and ongoing controversy regarding screening and potential treatment outcomes of prostate cancer has been discussed with the patient. The meaning of a false positive PSA and a false negative PSA has  been discussed. He indicates understanding of the limitations of this screening test and wishes not to proceed with screening PSA testing.   IMMUNIZATIONS:   - Tdap: Tetanus vaccination status reviewed: last tetanus booster within 10 years. - Influenza: Refused  SCREENING: - Colonoscopy: cologuard negative and UTD  Discussed with patient purpose of the colonoscopy is to detect colon cancer at curable precancerous or early stages   PATIENT COUNSELING:    Sexuality: Discussed sexually transmitted diseases, partner selection, use of condoms, avoidance of unintended pregnancy   and contraceptive alternatives.   Advised to avoid cigarette smoking.  I discussed with the patient that most people either abstain from alcohol or drink within safe limits (<=14/week and <=4 drinks/occasion for males, <=7/weeks and <= 3 drinks/occasion for females) and that the risk for alcohol disorders and other health effects rises proportionally with the number of drinks per week and how often a drinker exceeds daily limits.  Discussed cessation/primary prevention of drug use and availability of treatment for abuse.   Diet: Encouraged to adjust caloric intake to maintain  or achieve ideal body weight, to reduce intake of dietary saturated fat and total fat, to limit sodium intake by avoiding high sodium foods and not adding table salt, and to maintain adequate dietary potassium and calcium preferably from fresh fruits, vegetables, and low-fat dairy products.    stressed the importance of regular exercise  Injury prevention: Discussed safety belts, safety helmets, smoke detector, smoking near bedding or upholstery.   Dental health: Discussed importance of regular tooth brushing, flossing, and dental visits.   Follow up plan: NEXT PREVENTATIVE PHYSICAL DUE IN 1 YEAR. Return in about 1 year (around 08/18/2020) for CPE.

## 2019-08-19 NOTE — Assessment & Plan Note (Addendum)
Treated through GI in 2020.

## 2019-08-20 LAB — CBC WITH DIFFERENTIAL/PLATELET
Basophils Absolute: 0 10*3/uL (ref 0.0–0.2)
Basos: 1 %
EOS (ABSOLUTE): 0.1 10*3/uL (ref 0.0–0.4)
Eos: 2 %
Hematocrit: 49.4 % (ref 37.5–51.0)
Hemoglobin: 16.7 g/dL (ref 13.0–17.7)
Immature Grans (Abs): 0 10*3/uL (ref 0.0–0.1)
Immature Granulocytes: 0 %
Lymphocytes Absolute: 1.9 10*3/uL (ref 0.7–3.1)
Lymphs: 44 %
MCH: 31.3 pg (ref 26.6–33.0)
MCHC: 33.8 g/dL (ref 31.5–35.7)
MCV: 93 fL (ref 79–97)
Monocytes Absolute: 0.4 10*3/uL (ref 0.1–0.9)
Monocytes: 10 %
Neutrophils Absolute: 1.8 10*3/uL (ref 1.4–7.0)
Neutrophils: 43 %
Platelets: 217 10*3/uL (ref 150–450)
RBC: 5.33 x10E6/uL (ref 4.14–5.80)
RDW: 13 % (ref 11.6–15.4)
WBC: 4.2 10*3/uL (ref 3.4–10.8)

## 2019-08-20 LAB — LIPID PANEL W/O CHOL/HDL RATIO
Cholesterol, Total: 153 mg/dL (ref 100–199)
HDL: 32 mg/dL — ABNORMAL LOW (ref >39)
LDL Chol Calc (NIH): 108 mg/dL — ABNORMAL HIGH (ref 0–99)
Triglycerides: 64 mg/dL (ref 0–149)
VLDL Cholesterol Cal: 13 mg/dL (ref 5–40)

## 2019-08-20 LAB — COMPREHENSIVE METABOLIC PANEL
ALT: 13 IU/L (ref 0–44)
AST: 17 IU/L (ref 0–40)
Albumin/Globulin Ratio: 1.6 (ref 1.2–2.2)
Albumin: 4.2 g/dL (ref 3.8–4.9)
Alkaline Phosphatase: 92 IU/L (ref 39–117)
BUN/Creatinine Ratio: 7 — ABNORMAL LOW (ref 9–20)
BUN: 10 mg/dL (ref 6–24)
Bilirubin Total: 0.6 mg/dL (ref 0.0–1.2)
CO2: 24 mmol/L (ref 20–29)
Calcium: 9.8 mg/dL (ref 8.7–10.2)
Chloride: 107 mmol/L — ABNORMAL HIGH (ref 96–106)
Creatinine, Ser: 1.42 mg/dL — ABNORMAL HIGH (ref 0.76–1.27)
GFR calc Af Amer: 62 mL/min/{1.73_m2} (ref >59)
GFR calc non Af Amer: 54 mL/min/{1.73_m2} — ABNORMAL LOW (ref >59)
Globulin, Total: 2.7 g/dL (ref 1.5–4.5)
Glucose: 105 mg/dL — ABNORMAL HIGH (ref 65–99)
Potassium: 4.8 mmol/L (ref 3.5–5.2)
Sodium: 145 mmol/L — ABNORMAL HIGH (ref 134–144)
Total Protein: 6.9 g/dL (ref 6.0–8.5)

## 2019-08-26 ENCOUNTER — Ambulatory Visit: Payer: Medicaid Other | Admitting: Family Medicine

## 2019-10-16 ENCOUNTER — Other Ambulatory Visit: Payer: Self-pay

## 2019-10-16 ENCOUNTER — Encounter (HOSPITAL_COMMUNITY): Payer: Self-pay | Admitting: Emergency Medicine

## 2019-10-16 ENCOUNTER — Emergency Department (HOSPITAL_COMMUNITY)
Admission: EM | Admit: 2019-10-16 | Discharge: 2019-10-16 | Disposition: A | Discharge status: Home / Self Care | Payer: Medicaid Other | Attending: Emergency Medicine | Admitting: Emergency Medicine

## 2019-10-16 ENCOUNTER — Emergency Department (HOSPITAL_COMMUNITY): Payer: Medicaid Other

## 2019-10-16 DIAGNOSIS — I4891 Unspecified atrial fibrillation: Secondary | ICD-10-CM | POA: Insufficient documentation

## 2019-10-16 DIAGNOSIS — I110 Hypertensive heart disease with heart failure: Secondary | ICD-10-CM | POA: Insufficient documentation

## 2019-10-16 DIAGNOSIS — I5033 Acute on chronic diastolic (congestive) heart failure: Secondary | ICD-10-CM | POA: Insufficient documentation

## 2019-10-16 DIAGNOSIS — Z7901 Long term (current) use of anticoagulants: Secondary | ICD-10-CM | POA: Diagnosis not present

## 2019-10-16 DIAGNOSIS — R0789 Other chest pain: Secondary | ICD-10-CM | POA: Insufficient documentation

## 2019-10-16 DIAGNOSIS — Z79899 Other long term (current) drug therapy: Secondary | ICD-10-CM | POA: Diagnosis not present

## 2019-10-16 DIAGNOSIS — R0602 Shortness of breath: Secondary | ICD-10-CM | POA: Diagnosis not present

## 2019-10-16 DIAGNOSIS — I160 Hypertensive urgency: Secondary | ICD-10-CM | POA: Insufficient documentation

## 2019-10-16 DIAGNOSIS — R079 Chest pain, unspecified: Secondary | ICD-10-CM | POA: Diagnosis not present

## 2019-10-16 DIAGNOSIS — B182 Chronic viral hepatitis C: Secondary | ICD-10-CM | POA: Insufficient documentation

## 2019-10-16 LAB — CBC
HCT: 48.2 % (ref 39.0–52.0)
Hemoglobin: 16.2 g/dL (ref 13.0–17.0)
MCH: 31.6 pg (ref 26.0–34.0)
MCHC: 33.6 g/dL (ref 30.0–36.0)
MCV: 94.1 fL (ref 80.0–100.0)
Platelets: 220 10*3/uL (ref 150–400)
RBC: 5.12 MIL/uL (ref 4.22–5.81)
RDW: 13.2 % (ref 11.5–15.5)
WBC: 3.8 10*3/uL — ABNORMAL LOW (ref 4.0–10.5)
nRBC: 0 % (ref 0.0–0.2)

## 2019-10-16 LAB — BASIC METABOLIC PANEL
Anion gap: 11 (ref 5–15)
BUN: 11 mg/dL (ref 6–20)
CO2: 22 mmol/L (ref 22–32)
Calcium: 9.2 mg/dL (ref 8.9–10.3)
Chloride: 106 mmol/L (ref 98–111)
Creatinine, Ser: 1.48 mg/dL — ABNORMAL HIGH (ref 0.61–1.24)
GFR calc Af Amer: 59 mL/min — ABNORMAL LOW (ref >60)
GFR calc non Af Amer: 51 mL/min — ABNORMAL LOW (ref >60)
Glucose, Bld: 173 mg/dL — ABNORMAL HIGH (ref 70–99)
Potassium: 3.7 mmol/L (ref 3.5–5.1)
Sodium: 139 mmol/L (ref 135–145)

## 2019-10-16 LAB — TROPONIN I (HIGH SENSITIVITY)
Troponin I (High Sensitivity): 3 ng/L (ref <18)
Troponin I (High Sensitivity): 3 ng/L (ref <18)

## 2019-10-16 LAB — BRAIN NATRIURETIC PEPTIDE: B Natriuretic Peptide: 280.8 pg/mL — ABNORMAL HIGH (ref 0.0–100.0)

## 2019-10-16 MED ORDER — FUROSEMIDE 10 MG/ML IJ SOLN
40.0000 mg | Freq: Once | INTRAMUSCULAR | Status: AC
  Administered 2019-10-16: 40 mg via INTRAVENOUS
  Filled 2019-10-16: qty 4

## 2019-10-16 MED ORDER — CLONIDINE HCL 0.2 MG PO TABS
0.2000 mg | ORAL_TABLET | Freq: Once | ORAL | Status: AC
  Administered 2019-10-16: 0.2 mg via ORAL
  Filled 2019-10-16: qty 1

## 2019-10-16 MED ORDER — SODIUM CHLORIDE 0.9% FLUSH: 3.0000 mL | Freq: Once | INTRAVENOUS | Status: DC

## 2019-10-16 NOTE — ED Triage Notes (Signed)
C/o SOB and pain to center of chest x 2 days.  States he got up twice during the night with increased SOB.

## 2019-10-16 NOTE — Discharge Instructions (Addendum)
You were seen in the emergency department for elevated blood pressures and increased shortness of breath.  You had blood work EKG and chest x-ray that showed some element of congestive heart failure.  Your breathing symptoms improved after an extra dose of diuretics.  We also gave you an extra dose of your blood pressure medicine.  Please contact your cardiologist as they may need to make further adjustments with your medications.  Return to the emergency department if any worsening or concerning symptoms.

## 2019-10-16 NOTE — ED Notes (Signed)
Pt d/c home per MD order. Discharge summary reviewed with pt, pt verbalizes understanding. Off unit via WC- no s/s of acute distress noted.

## 2019-10-16 NOTE — ED Notes (Signed)
Patient ambulated in room with a steady gait. O2 Sats were 98% room air while ambulating. Pt stated he felt fine. Dr. Charm Barges has been notified.

## 2019-10-16 NOTE — ED Provider Notes (Signed)
[facility] EMERGENCY DEPARTMENT Provider Note   CSN: [id number] Arrival date & time: 10/16/19  0809     History Chief Complaint  Patient presents with  . Shortness of Breath  . Chest Pain    Dwayne Ramirez is a 59 y.o. male.  He has a history of A. fib on anticoagulation, dilated cardiomyopathy with heart failure on diuretics.  He said his cardiologist took him off of diltiazem about a month ago and he has had increasing shortness of breath dyspnea on exertion orthopnea and elevated blood pressures.  He gets chest pain intermittently and said he had some earlier today but does not have any now.  No cough no peripheral edema no fevers chills.  He said he woke up twice last night with shortness of breath and had to get up and walk around.  Does not know why he was taken off of diltiazem.  The history is provided by the patient.  Shortness of Breath Severity:  Moderate Onset quality:  Gradual Duration:  1 month Timing:  Intermittent Progression:  Worsening Chronicity:  Recurrent Relieved by:  Nothing Worsened by:  Activity Ineffective treatments:  Diuretics Associated symptoms: chest pain   Associated symptoms: no abdominal pain, no cough, no diaphoresis, no fever, no headaches, no hemoptysis, no neck pain, no rash, no sore throat, no sputum production, no vomiting and no wheezing   Chest Pain Pain location:  L chest Pain quality: stabbing   Pain severity:  Moderate Onset quality:  Unable to specify Timing:  Intermittent Progression:  Unchanged Chronicity:  Chronic Relieved by:  Rest Worsened by:  Certain positions and movement Ineffective treatments:  None tried Associated symptoms: shortness of breath   Associated symptoms: no abdominal pain, no cough, no diaphoresis, no fever, no headache, no nausea and no vomiting   Risk factors: coronary artery disease, hypertension and male sex        Past Medical History:  Diagnosis Date  . A-fib ([place])   . Acute  on chronic left systolic heart failure ([place]) 03/2016  . Dilated cardiomyopathy ([place])   . Hypertension   . Obesity     Patient Active Problem List   Diagnosis Date Noted  . Hepatitis C, chronic ([place]) 02/21/2019  . A-fib ([place])   . Hypertension   . Dilated cardiomyopathy ([place])   . Acute on chronic left systolic heart failure ([place]) 03/27/2016    History reviewed. No pertinent surgical history.     Family History  Problem Relation Age of Onset  . Hypertension Mother   . Hypertension Father   . Cancer Neg Hx   . Diabetes Neg Hx   . COPD Neg Hx   . Heart disease Neg Hx   . Stroke Neg Hx     Social History   Tobacco Use  . Smoking status: Never Smoker  . Smokeless tobacco: Never Used  Substance Use Topics  . Alcohol use: No  . Drug use: No    Home Medications Prior to Admission medications   Medication Sig Start Date End Date Taking? Authorizing Provider  BIDIL 20-37.5 MG tablet TAKE 1 TABLET BY MOUTH THREE TIMES DAILY 06/27/19   Volney American, PA-C  cloNIDine (CATAPRES) 0.2 MG tablet Take 1 tablet (0.2 mg total) by mouth 2 (two) times daily. 04/02/16   Dixie Dials, MD  furosemide (LASIX) 40 MG tablet Take 1 tablet (40 mg total) by mouth daily. 08/19/19   Volney American, PA-C  losartan (COZAAR) 100 MG tablet  Take 1 tablet (100 mg total) by mouth daily. 08/19/19   Particia Nearing, PA-C  metoprolol tartrate (LOPRESSOR) 25 MG tablet Take 1 tablet (25 mg total) by mouth 2 (two) times daily. 08/19/19   Particia Nearing, PA-C  Rivaroxaban (XARELTO) 15 MG TABS tablet Take 1 tablet (15 mg total) by mouth daily. 08/19/19   Particia Nearing, PA-C  spironolactone (ALDACTONE) 25 MG tablet Take 0.5 tablets (12.5 mg total) by mouth 2 (two) times daily. 08/19/19   Particia Nearing, PA-C    Allergies    Patient has no known allergies.  Review of Systems   Review of Systems  Constitutional: Negative for diaphoresis and fever.  HENT: Negative for  sore throat.   Eyes: Negative for visual disturbance.  Respiratory: Positive for shortness of breath. Negative for cough, hemoptysis, sputum production and wheezing.   Cardiovascular: Positive for chest pain. Negative for leg swelling.  Gastrointestinal: Negative for abdominal pain, nausea and vomiting.  Genitourinary: Negative for dysuria.  Musculoskeletal: Negative for neck pain.  Skin: Negative for rash.  Neurological: Negative for headaches.    Physical Exam Updated Vital Signs BP (!) 186/115 (BP Location: Right Arm)   Pulse 100   Temp 98.7 F (37.1 C)   Resp 20   Ht 6\' 3"  (1.905 m)   Wt 122.5 kg   SpO2 98%   BMI 33.75 kg/m   Physical Exam Vitals and nursing note reviewed.  Constitutional:      Appearance: He is well-developed.  HENT:     Head: Normocephalic and atraumatic.  Eyes:     Conjunctiva/sclera: Conjunctivae normal.  Cardiovascular:     Rate and Rhythm: Tachycardia present. Rhythm irregular.     Heart sounds: No murmur.  Pulmonary:     Effort: Pulmonary effort is normal. No respiratory distress.     Breath sounds: Normal breath sounds.  Abdominal:     Palpations: Abdomen is soft.     Tenderness: There is no abdominal tenderness.  Musculoskeletal:        General: Normal range of motion.     Cervical back: Neck supple.     Right lower leg: No tenderness. No edema.     Left lower leg: No tenderness. No edema.  Skin:    General: Skin is warm and dry.     Capillary Refill: Capillary refill takes less than 2 seconds.  Neurological:     General: No focal deficit present.     Mental Status: He is alert.     ED Results / Procedures / Treatments   Labs (all labs ordered are listed, but only abnormal results are displayed) Labs Reviewed  BASIC METABOLIC PANEL - Abnormal; Notable for the following components:      Result Value   Glucose, Bld 173 (*)    Creatinine, Ser 1.48 (*)    GFR calc non Af Amer 51 (*)    GFR calc Af Amer 59 (*)    All other  components within normal limits  CBC - Abnormal; Notable for the following components:   WBC 3.8 (*)    All other components within normal limits  BRAIN NATRIURETIC PEPTIDE - Abnormal; Notable for the following components:   B Natriuretic Peptide 280.8 (*)    All other components within normal limits  TROPONIN I (HIGH SENSITIVITY)  TROPONIN I (HIGH SENSITIVITY)    EKG EKG Interpretation  Date/Time:  Sunday October 16 2019 08:42:03 EDT Ventricular Rate:  93 PR Interval:  QRS Duration: 98 QT Interval:  350 QTC Calculation: 435 R Axis:   -60 Text Interpretation: Atrial fibrillation Left anterior fascicular block Moderate voltage criteria for LVH, may be normal variant ( R in aVL , Cornell product ) ST & T wave abnormality, consider lateral ischemia Abnormal ECG improved ischemic changes from prior 10/17 Confirmed by Meridee Score [phone]) on 10/16/2019 11:01:48 AM   Radiology DG Chest 2 View  Result Date: 10/16/2019 CLINICAL DATA:  Chest pain.  Shortness of breath. EXAM: CHEST - 2 VIEW COMPARISON:  March 29, 2016 FINDINGS: Stable cardiomegaly. The hila and mediastinum are normal. No pneumothorax. No pulmonary nodules or masses. No focal infiltrates. No other acute abnormalities. IMPRESSION: No active cardiopulmonary disease. Electronically Signed   By: Gerome Sam III M.D   On: 10/16/2019 09:54    Procedures Procedures (including critical care time)  Medications Ordered in ED Medications  sodium chloride flush (NS) 0.9 % injection 3 mL (has no administration in time range)  furosemide (LASIX) injection 40 mg (has no administration in time range)    ED Course  I have reviewed the triage vital signs and the nursing notes.  Pertinent labs & imaging results that were available during my care of the patient were reviewed by me and considered in my medical decision making (see chart for details).  Clinical Course as of Oct 16 2150  Sun Oct 16, 2019  1408 Initial labs showing  a low white blood cell count of 3.8 although his baseline seems to run around mid 4.  Creatinine elevated at 1.48 which is his baseline.  Normal potassium.   [MB]  1453 BNP elevated at 280.  Blood pressure has come down somewhat here.  Given an IV dose of his Lasix.  Will do trending pulse ox.   [MB]  1459 Patient remains looking very comfortable here.  Not hypoxic lying down fairly flat.  Getting some high readings of his blood pressure but will try to get a manual woman.  Recommended that he call his cardiology office tomorrow and see if they want to readjust his blood pressure medications.   [MB]  1519 Patient ambulated in the department remained sats in the high 90s.  Feels improved with his breathing since arrival.  Blood pressure is still quite elevated.  We will give him an extra dose of his clonidine.   [MB]  1635 Patient's blood pressures come down to 150s over 110s.  He said he is due for his afternoon pills.  He is comfortable with discharge and is going to follow-up with his cardiologist regarding his blood pressure medicines.  Return instructions discussed.   [MB]    Clinical Course User Index [MB] Terrilee Files, MD   MDM Rules/Calculators/A&P                     This patient complains of shortness of breath and elevated blood pressure; this involves an extensive number of treatment Options and is a complaint that carries with it a high risk of complications and Morbidity. The differential includes CHF, hypertensive urgency, medication noncompliance, ACS, pneumonia, Covid, metabolic derangement, renal failure  I ordered, reviewed and interpreted labs, which included white blood cell count low at 3.8 lower from his baseline of.  Hemoglobin stable.  Chemistry showing an elevated creatinine of 1.48 which is essentially baseline.  Delta troponins unchanged.  BNP of 280 higher than prior few years ago. I ordered medication Lasix with improvement in his shortness of breath  I ordered  imaging studies which included chest x-ray and I independently    visualized and interpreted imaging no acute disease Previous records obtained and reviewed in epic including his last cardiology visit a few months ago where they took him off of his Cardizem.  Its unclear why they took him off it and the patient is very frustrated with this as that is when his blood pressure started rising.  After the interventions stated above, I reevaluated the patient and found his breathing to be improved.  Blood pressure still elevated and gets a medication for that with some improvement.  Do not want to make any major changes with his blood pressure medicines as cardiology clearly had a thought process why they took him off his Cardizem.  I recommended the patient that he reconnect with cardiology so they can get his blood pressure regimen straightened out.  Patient is comfortable with plan.  Return instructions discussed.  CHA2DS2/VAS Stroke Risk Points  Current as of just now     2 >= 2 Points: High Risk  1 - 1.99 Points: Medium Risk  0 Points: Low Risk    This is the only CHA2DS2/VAS Stroke Risk Points available for the past  year.: Last Change: N/A     Details    This score determines the patient's risk of having a stroke if the  patient has atrial fibrillation.       Points Metrics  1 Has Congestive Heart Failure:  Yes    Current as of just now  0 Has Vascular Disease:  No    Current as of just now  1 Has Hypertension:  Yes    Current as of just now  0 Age:  67    Current as of just now  0 Has Diabetes:  No    Current as of just now  0 Had Stroke:  No  Had TIA:  No  Had thromboembolism:  No    Current as of just now  0 Male:  No    Current as of just now            Final Clinical Impression(s) / ED Diagnoses Final diagnoses:  Hypertensive urgency  Acute on chronic diastolic CHF (congestive heart failure) (HCC)    Rx / DC Orders ED Discharge Orders    None       Terrilee Files, MD 10/16/19 2156

## 2019-10-17 ENCOUNTER — Telehealth: Payer: Self-pay | Admitting: Family Medicine

## 2019-10-17 ENCOUNTER — Other Ambulatory Visit: Payer: Self-pay

## 2019-10-17 ENCOUNTER — Ambulatory Visit (INDEPENDENT_AMBULATORY_CARE_PROVIDER_SITE_OTHER): Payer: Medicaid Other | Admitting: Family Medicine

## 2019-10-17 ENCOUNTER — Other Ambulatory Visit: Payer: Self-pay | Admitting: Family Medicine

## 2019-10-17 ENCOUNTER — Encounter: Payer: Self-pay | Admitting: Family Medicine

## 2019-10-17 VITALS — BP 180/130 | HR 99 | Temp 98.9°F | Wt 268.0 lb

## 2019-10-17 DIAGNOSIS — I5023 Acute on chronic systolic (congestive) heart failure: Secondary | ICD-10-CM | POA: Diagnosis not present

## 2019-10-17 DIAGNOSIS — I10 Essential (primary) hypertension: Secondary | ICD-10-CM

## 2019-10-17 DIAGNOSIS — I4891 Unspecified atrial fibrillation: Secondary | ICD-10-CM | POA: Diagnosis not present

## 2019-10-17 DIAGNOSIS — I420 Dilated cardiomyopathy: Secondary | ICD-10-CM

## 2019-10-17 MED ORDER — DILTIAZEM HCL ER 90 MG PO CP12: 90.0000 mg | ORAL_CAPSULE | Freq: Two times a day (BID) | ORAL | 0 refills | Status: DC

## 2019-10-17 NOTE — Assessment & Plan Note (Signed)
Significantly uncontrolled, restart diltiazem and closely monitor home readings. WIll get him back in with Cardiology ASAP but f/u in 2 weeks if not already seen there

## 2019-10-17 NOTE — Assessment & Plan Note (Signed)
Rate much higher than his typical baseline, unclear what the average has been off diltizem. Restart, and follow up as soon as possible with Cardiology

## 2019-10-17 NOTE — Assessment & Plan Note (Signed)
Continue per Cardiology recommendation

## 2019-10-17 NOTE — Progress Notes (Signed)
BP (!) 180/130   Pulse 99   Temp 98.9 F (37.2 C) (Oral)   Wt 268 lb (121.6 kg)   SpO2 96%   BMI 33.50 kg/m    Subjective:    Patient ID: Dwayne Ramirez, male    DOB: 09-15-1960, 59 y.o.   MRN: [id number]  HPI: Dwayne Ramirez is a 59 y.o. male  Chief Complaint  Patient presents with  . Hypertension  . Shortness of Breath    pt states went to the ED yesterday.   Presenting today for ER f/u for acute SOB from HF exacerbation. Was found to have an elevated BNP and significantly elevated BPs. Given IV lasix and clonidine with some improvement. States since diltiazem was d/c'd back in December by Cardiology for bradycardia he's slowly and steadily become more short of breath but was trying to ride things out. BPs have been staying 180/130s-140s range when checked at home. Was discharged home with no medication changes and told to f/u with Cardiologist regarding med changes. Called Cardiology and was told to call PCP for further instructions so made this appt.   Relevant past medical, surgical, family and social history reviewed and updated as indicated. Interim medical history since our last visit reviewed. Allergies and medications reviewed and updated.  Review of Systems  Per HPI unless specifically indicated above     Objective:    BP (!) 180/130   Pulse 99   Temp 98.9 F (37.2 C) (Oral)   Wt 268 lb (121.6 kg)   SpO2 96%   BMI 33.50 kg/m   Wt Readings from Last 3 Encounters:  10/17/19 268 lb (121.6 kg)  10/16/19 270 lb (122.5 kg)  08/19/19 274 lb (124.3 kg)    Physical Exam Vitals and nursing note reviewed.  Constitutional:      Appearance: Normal appearance.  HENT:     Head: Atraumatic.  Eyes:     Extraocular Movements: Extraocular movements intact.     Conjunctiva/sclera: Conjunctivae normal.  Cardiovascular:     Rate and Rhythm: Normal rate.  Pulmonary:     Effort: Pulmonary effort is normal. No respiratory distress.     Breath sounds: Normal breath sounds.   Musculoskeletal:        General: Normal range of motion.     Cervical back: Normal range of motion and neck supple.  Skin:    General: Skin is warm and dry.  Neurological:     General: No focal deficit present.     Mental Status: He is oriented to person, place, and time.  Psychiatric:        Mood and Affect: Mood normal.        Thought Content: Thought content normal.        Judgment: Judgment normal.     Results for orders placed or performed during the hospital encounter of 10/16/19  Basic metabolic panel  Result Value Ref Range   Sodium 139 135 - 145 mmol/L   Potassium 3.7 3.5 - 5.1 mmol/L   Chloride 106 98 - 111 mmol/L   CO2 22 22 - 32 mmol/L   Glucose, Bld 173 (H) 70 - 99 mg/dL   BUN 11 6 - 20 mg/dL   Creatinine, Ser 7.82 (H) 0.61 - 1.24 mg/dL   Calcium 9.2 8.9 - 95.6 mg/dL   GFR calc non Af Amer 51 (L) >60 mL/min   GFR calc Af Amer 59 (L) >60 mL/min   Anion gap 11 5 - 15  CBC  Result Value Ref Range   WBC 3.8 (L) 4.0 - 10.5 K/uL   RBC 5.12 4.22 - 5.81 MIL/uL   Hemoglobin 16.2 13.0 - 17.0 g/dL   HCT 48.2 39.0 - 52.0 %   MCV 94.1 80.0 - 100.0 fL   MCH 31.6 26.0 - 34.0 pg   MCHC 33.6 30.0 - 36.0 g/dL   RDW 13.2 11.5 - 15.5 %   Platelets 220 150 - 400 K/uL   nRBC 0.0 0.0 - 0.2 %  Brain natriuretic peptide  Result Value Ref Range   B Natriuretic Peptide 280.8 (H) 0.0 - 100.0 pg/mL  Troponin I (High Sensitivity)  Result Value Ref Range   Troponin I (High Sensitivity) 3 <18 ng/L  Troponin I (High Sensitivity)  Result Value Ref Range   Troponin I (High Sensitivity) 3 <18 ng/L      Assessment & Plan:   Problem List Items Addressed This Visit      Cardiovascular and Mediastinum   Acute on chronic left systolic heart failure (HCC) - Primary    With recent acute exacerbation, improved with IV lasix at ER. Will continue present medications aside from readdition of cardizem and get him back in with Cardiology as soon as possible      Relevant Medications    diltiazem (CARDIZEM SR) 90 MG 12 hr capsule   Other Relevant Orders   Ambulatory referral to Cardiology   A-fib [facility])    Rate much higher than his typical baseline, unclear what the average has been off diltizem. Restart, and follow up as soon as possible with Cardiology      Relevant Medications   diltiazem (CARDIZEM SR) 90 MG 12 hr capsule   Other Relevant Orders   Ambulatory referral to Cardiology   Hypertension    Significantly uncontrolled, restart diltiazem and closely monitor home readings. WIll get him back in with Cardiology ASAP but f/u in 2 weeks if not already seen there      Relevant Medications   diltiazem (CARDIZEM SR) 90 MG 12 hr capsule   Other Relevant Orders   Ambulatory referral to Cardiology   Dilated cardiomyopathy ([place])    Continue per Cardiology recommendation      Relevant Medications   diltiazem (CARDIZEM SR) 90 MG 12 hr capsule   Other Relevant Orders   Ambulatory referral to Cardiology       Follow up plan: Return for as scheduled.

## 2019-10-17 NOTE — Telephone Encounter (Signed)
Pt does not want to make post er fup until rachel let him know what to do. Pt had a lot of fluid around his heart pt was in afib and they drained the fluid yesterday. Pt went to [facility]  and pt has copd also. Per pt he was told to stop taking diltiazem and he patient said that is why he ended up  going to er. Pt would like to know what medication to take. Pt states he does not have any bp med. Please advice

## 2019-10-17 NOTE — Assessment & Plan Note (Signed)
With recent acute exacerbation, improved with IV lasix at ER. Will continue present medications aside from readdition of cardizem and get him back in with Cardiology as soon as possible

## 2019-10-17 NOTE — Telephone Encounter (Signed)
Requested medication (s) are due for refill today:   Rx written today.   Pt requesting a 90 day supply.  Requested medication (s) are on the active medication list:   Yes  Future visit scheduled:   Had appt with Roosvelt Maser today.   Last ordered: Today.   Requesting a 90 day supply.     Requested Prescriptions  Pending Prescriptions Disp Refills   diltiazem (CARDIZEM SR) 90 MG 12 hr capsule [Pharmacy Med Name: DILTIAZEM ER 90MG  CAPSULES (12 HR)] 180 capsule     Sig: TAKE 1 CAPSULE(90 MG) BY MOUTH TWICE DAILY      Cardiovascular:  Calcium Channel Blockers Failed - 10/17/2019  3:22 PM      Failed - Last BP in normal range    BP Readings from Last 1 Encounters:  10/17/19 (!) 180/130          Passed - Valid encounter within last 6 months    Recent Outpatient Visits           Today Acute on chronic left systolic heart failure [facility])   [facility] [place], [place], [place]   1 month ago Acute on chronic left systolic heart failure [facility])   [facility], [place]   8 months ago Essential hypertension   [facility] [place], [place], [place]   1 year ago Essential hypertension   [facility], [place]   1 year ago Essential hypertension   [facility] [place], [place], [place]       Future Appointments             In 10 months [place], Maurice March, PA-C [place], [facility]

## 2019-10-17 NOTE — Telephone Encounter (Signed)
Spoke with Fleet Contras, she suggested that pt follows up with cardiologist. Explained this to pt, he states that he has called there office and they said that they can't do anything for him unless Fleet Contras tells them what needs to be done. Pt states that Fleet Contras stopped his medication and now he doesn't have anything to take and needs a new medicine. Scheduled appt for today at 2:30.

## 2019-11-17 ENCOUNTER — Telehealth: Payer: Self-pay | Admitting: Family Medicine

## 2019-11-17 NOTE — Telephone Encounter (Signed)
Requested Prescriptions  Pending Prescriptions Disp Refills  . diltiazem (CARDIZEM SR) 90 MG 12 hr capsule [Pharmacy Med Name: DILTIAZEM ER 90MG  CAPSULES (12 HR)] 180 capsule 1    Sig: TAKE 1 CAPSULE(90 MG) BY MOUTH TWICE DAILY     Cardiovascular:  Calcium Channel Blockers Failed - 11/17/2019  4:45 PM      Failed - Last BP in normal range    BP Readings from Last 1 Encounters:  10/17/19 (!) 180/130         Passed - Valid encounter within last 6 months    Recent Outpatient Visits          1 month ago Acute on chronic left systolic heart failure [facility])   [facility] [place], [place], [place]   3 months ago Acute on chronic left systolic heart failure [facility])   [facility], [place]   9 months ago Essential hypertension   [facility], [place]   1 year ago Essential hypertension   [facility], [place]   1 year ago Essential hypertension   [facility] [place], [place], [place]      Future Appointments            In 9 months [place], Maurice March, PA-C [facility], [facility]

## 2019-11-18 NOTE — Telephone Encounter (Signed)
Called and spoke with pharmacy, they were able to get the medication to go through, they are getting it ready for the patient.

## 2019-11-18 NOTE — Telephone Encounter (Signed)
Patient called in stating pharmacy told him his insurance did not go through for this medication. PA may be needed. Please advise.

## 2019-11-23 ENCOUNTER — Other Ambulatory Visit: Payer: Self-pay | Admitting: Cardiology

## 2019-11-23 DIAGNOSIS — I340 Nonrheumatic mitral (valve) insufficiency: Secondary | ICD-10-CM | POA: Diagnosis not present

## 2019-11-23 DIAGNOSIS — I4811 Longstanding persistent atrial fibrillation: Secondary | ICD-10-CM | POA: Diagnosis not present

## 2019-11-23 DIAGNOSIS — I071 Rheumatic tricuspid insufficiency: Secondary | ICD-10-CM | POA: Diagnosis not present

## 2019-11-23 DIAGNOSIS — I10 Essential (primary) hypertension: Secondary | ICD-10-CM | POA: Diagnosis not present

## 2019-11-23 DIAGNOSIS — R0609 Other forms of dyspnea: Secondary | ICD-10-CM

## 2019-11-23 DIAGNOSIS — I208 Other forms of angina pectoris: Secondary | ICD-10-CM

## 2019-11-23 DIAGNOSIS — I2089 Other forms of angina pectoris: Secondary | ICD-10-CM

## 2019-11-24 ENCOUNTER — Other Ambulatory Visit: Payer: Self-pay | Admitting: Cardiology

## 2019-11-24 DIAGNOSIS — R0609 Other forms of dyspnea: Secondary | ICD-10-CM

## 2019-12-01 ENCOUNTER — Other Ambulatory Visit: Payer: Self-pay

## 2019-12-01 ENCOUNTER — Ambulatory Visit
Admission: RE | Admit: 2019-12-01 | Discharge: 2019-12-01 | Disposition: A | Discharge status: Home / Self Care | Payer: Medicaid Other | Source: Ambulatory Visit | Attending: Cardiology | Admitting: Cardiology

## 2019-12-01 DIAGNOSIS — I4811 Longstanding persistent atrial fibrillation: Secondary | ICD-10-CM

## 2019-12-01 DIAGNOSIS — R0600 Dyspnea, unspecified: Secondary | ICD-10-CM | POA: Insufficient documentation

## 2019-12-01 DIAGNOSIS — R0609 Other forms of dyspnea: Secondary | ICD-10-CM

## 2019-12-01 DIAGNOSIS — I208 Other forms of angina pectoris: Secondary | ICD-10-CM | POA: Insufficient documentation

## 2019-12-01 MED ORDER — TECHNETIUM TC 99M TETROFOSMIN IV KIT
29.9600 | PACK | Freq: Once | INTRAVENOUS | Status: AC | PRN
  Administered 2019-12-01: 29.96 via INTRAVENOUS

## 2019-12-01 MED ORDER — TECHNETIUM TC 99M TETROFOSMIN IV KIT
10.0000 | PACK | Freq: Once | INTRAVENOUS | Status: AC | PRN
  Administered 2019-12-01: 10.72 via INTRAVENOUS

## 2019-12-01 MED ORDER — REGADENOSON 0.4 MG/5ML IV SOLN
0.4000 mg | Freq: Once | INTRAVENOUS | Status: AC
  Administered 2019-12-01: 0.4 mg via INTRAVENOUS

## 2019-12-07 LAB — NM MYOCAR MULTI W/SPECT W/WALL MOTION / EF
Estimated workload: 1 METS
Exercise duration (min): 1 min
Exercise duration (sec): 15 s
LV dias vol: 218 mL (ref 62–150)
LV sys vol: 110 mL
Peak HR: 80 {beats}/min
Percent HR: 49 %
Rest HR: 63 {beats}/min
SDS: 1
SRS: 13
SSS: 2
TID: 1.02

## 2019-12-09 ENCOUNTER — Ambulatory Visit: Admission: RE | Admit: 2019-12-09 | Payer: Medicaid Other | Source: Ambulatory Visit

## 2019-12-13 DIAGNOSIS — I4811 Longstanding persistent atrial fibrillation: Secondary | ICD-10-CM | POA: Diagnosis not present

## 2019-12-13 DIAGNOSIS — I420 Dilated cardiomyopathy: Secondary | ICD-10-CM | POA: Diagnosis not present

## 2019-12-13 DIAGNOSIS — I071 Rheumatic tricuspid insufficiency: Secondary | ICD-10-CM | POA: Diagnosis not present

## 2019-12-13 DIAGNOSIS — I10 Essential (primary) hypertension: Secondary | ICD-10-CM | POA: Diagnosis not present

## 2020-02-07 ENCOUNTER — Encounter: Payer: Self-pay | Admitting: Family Medicine

## 2020-04-10 ENCOUNTER — Other Ambulatory Visit: Payer: Self-pay | Admitting: Family Medicine

## 2020-04-10 NOTE — Telephone Encounter (Signed)
Requested Prescriptions  Pending Prescriptions Disp Refills  . spironolactone (ALDACTONE) 25 MG tablet [Pharmacy Med Name: SPIRONOLACTONE 25MG  TABLETS] 90 tablet     Sig: TAKE 1/2 TABLET BY MOUTH TWICE DAILY     Cardiovascular: Diuretics - Aldosterone Antagonist Failed - 04/10/2020  4:04 AM      Failed - Cr in normal range and within 360 days    Creatinine, Ser  Date Value Ref Range Status  10/16/2019 1.48 (H) 0.61 - 1.24 mg/dL Final         Failed - Last BP in normal range    BP Readings from Last 1 Encounters:  10/17/19 (!) 180/130         Passed - K in normal range and within 360 days    Potassium  Date Value Ref Range Status  10/16/2019 3.7 3.5 - 5.1 mmol/L Final         Passed - Na in normal range and within 360 days    Sodium  Date Value Ref Range Status  10/16/2019 139 135 - 145 mmol/L Final  08/19/2019 145 (H) 134 - 144 mmol/L Final         Passed - Valid encounter within last 6 months    Recent Outpatient Visits          5 months ago Acute on chronic left systolic heart failure [facility])   [facility], PA-C   7 months ago Acute on chronic left systolic heart failure [facility])   [facility], [place]   1 year ago Essential hypertension   [facility], [place]   1 year ago Essential hypertension   [facility], [place]   2 years ago Essential hypertension   [facility] [place], [place], [place]      Future Appointments            In 4 months [place], [place], NP Dorie Rank, [facility]

## 2020-04-17 ENCOUNTER — Other Ambulatory Visit: Payer: Self-pay | Admitting: Nurse Practitioner

## 2020-04-17 DIAGNOSIS — R739 Hyperglycemia, unspecified: Secondary | ICD-10-CM

## 2020-05-17 ENCOUNTER — Other Ambulatory Visit: Payer: Self-pay | Admitting: Family Medicine

## 2020-05-26 ENCOUNTER — Other Ambulatory Visit: Payer: Self-pay | Admitting: Family Medicine

## 2020-07-03 DIAGNOSIS — Z23 Encounter for immunization: Secondary | ICD-10-CM | POA: Diagnosis not present

## 2020-07-03 DIAGNOSIS — I10 Essential (primary) hypertension: Secondary | ICD-10-CM | POA: Diagnosis not present

## 2020-07-03 DIAGNOSIS — I420 Dilated cardiomyopathy: Secondary | ICD-10-CM | POA: Diagnosis not present

## 2020-07-03 DIAGNOSIS — I480 Paroxysmal atrial fibrillation: Secondary | ICD-10-CM | POA: Diagnosis not present

## 2020-08-18 ENCOUNTER — Encounter: Payer: Self-pay | Admitting: Nurse Practitioner

## 2020-08-18 DIAGNOSIS — I5022 Chronic systolic (congestive) heart failure: Secondary | ICD-10-CM | POA: Insufficient documentation

## 2020-08-18 DIAGNOSIS — I071 Rheumatic tricuspid insufficiency: Secondary | ICD-10-CM | POA: Insufficient documentation

## 2020-08-18 DIAGNOSIS — I340 Nonrheumatic mitral (valve) insufficiency: Secondary | ICD-10-CM | POA: Insufficient documentation

## 2020-08-22 ENCOUNTER — Encounter: Payer: Medicaid Other | Admitting: Family Medicine

## 2020-08-22 ENCOUNTER — Encounter: Payer: Medicaid Other | Admitting: Nurse Practitioner

## 2020-08-22 NOTE — Progress Notes (Deleted)
There were no vitals taken for this visit.   Subjective:    Patient ID: Dwayne Ramirez, male    DOB: 06/25/1960, 60 y.o.   MRN: [id number]  HPI: Dwayne Ramirez is a 60 y.o. male presenting on 08/22/2020 for comprehensive medical examination. Current medical complaints include:{Blank single:19197::"none","***"}  He currently lives with: Interim Problems from his last visit: {Blank single:19197::"yes","no"}  HYPERTENSION / HYPERLIPIDEMIA Satisfied with current treatment? {Blank single:19197::"yes","no"} Duration of hypertension: {Blank single:19197::"chronic","months","years"} BP monitoring frequency: {Blank single:19197::"not checking","rarely","daily","weekly","monthly","a few times a day","a few times a week","a few times a month"} BP range:  BP medication side effects: {Blank single:19197::"yes","no"} Past BP meds: {Blank multiple:19196::"none","amlodipine","amlodipine/benazepril","atenolol","benazepril","benazepril/HCTZ","bisoprolol (bystolic)","carvedilol","chlorthalidone","clonidine","diltiazem","exforge HCT","HCTZ","irbesartan (avapro)","labetalol","lisinopril","lisinopril-HCTZ","losartan (cozaar)","methyldopa","nifedipine","olmesartan (benicar)","olmesartan-HCTZ","quinapril","ramipril","spironalactone","tekturna","valsartan","valsartan-HCTZ","verapamil"} Duration of hyperlipidemia: {Blank single:19197::"chronic","months","years"} Cholesterol medication side effects: {Blank single:19197::"yes","no"} Cholesterol supplements: {Blank multiple:19196::"none","fish oil","niacin","red yeast rice"} Past cholesterol medications: {Blank multiple:19196::"none","atorvastain (lipitor)","lovastatin (mevacor)","pravastatin (pravachol)","rosuvastatin (crestor)","simvastatin (zocor)","vytorin","fenofibrate (tricor)","gemfibrozil","ezetimide (zetia)","niaspan","lovaza"} Medication compliance: {Blank single:19197::"excellent compliance","good compliance","fair compliance","poor compliance"} Aspirin: {Blank  single:19197::"yes","no"} Recent stressors: {Blank single:19197::"yes","no"} Recurrent headaches: {Blank single:19197::"yes","no"} Visual changes: {Blank single:19197::"yes","no"} Palpitations: {Blank single:19197::"yes","no"} Dyspnea: {Blank single:19197::"yes","no"} Chest pain: {Blank single:19197::"yes","no"} Lower extremity edema: {Blank single:19197::"yes","no"} Dizzy/lightheaded: {Blank single:19197::"yes","no"}     FALL RISK: Fall Risk  08/19/2019 02/09/2018  Falls in the past year? 0 No  Number falls in past yr: 0 -  Injury with Fall? 0 -    Depression Screen Depression screen [facility] 2/9 08/19/2019 02/09/2018 01/15/2017  Decreased Interest 3 3 0  Down, Depressed, Hopeless 0 0 0  PHQ - 2 Score 3 3 0  Altered sleeping 0 3 -  Tired, decreased energy 3 3 -  Change in appetite 0 3 -  Feeling bad or failure about yourself  0 0 -  Trouble concentrating 0 0 -  Moving slowly or fidgety/restless 0 0 -  Suicidal thoughts 0 0 -  PHQ-9 Score 6 12 -  Difficult doing work/chores - Somewhat difficult -    Advanced Directives <no information>  Past Medical History:  Past Medical History:  Diagnosis Date  . A-fib (HCC)   . Acute on chronic left systolic heart failure (HCC) 03/2016  . Dilated cardiomyopathy (HCC)   . Hypertension   . Obesity     Surgical History:  No past surgical history on file.  Medications:  Current Outpatient Medications on File Prior to Visit  Medication Sig  . BIDIL 20-37.5 MG tablet TAKE 1 TABLET BY MOUTH THREE TIMES DAILY  . cloNIDine (CATAPRES) 0.2 MG tablet Take 1 tablet (0.2 mg total) by mouth 2 (two) times daily.  [place] Kitchen diltiazem (CARDIZEM SR) 90 MG 12 hr capsule TAKE 1 CAPSULE(90 MG) BY MOUTH TWICE DAILY  . furosemide (LASIX) 40 MG tablet Take 1 tablet (40 mg total) by mouth daily.  Mar[place] Kitchen losartan (COZAAR) 100 MG tablet Take 1 tablet (100 mg total) by mouth daily.  . metoprolol tartrate (LOPRESSOR) 25 MG tablet Take 1 tablet (25 mg total) by mouth 2  (two) times daily.  . Rivaroxaban (XARELTO) 15 MG TABS tablet Take 1 tablet (15 mg total) by mouth daily.  Mar[place] Kitchen spironolactone (ALDACTONE) 25 MG tablet TAKE 1/2 TABLET BY MOUTH TWICE DAILY   No current facility-administered medications on file prior to visit.    Allergies:  No Known Allergies  Social History:  Social History   Socioeconomic History  . Marital status: Single    Spouse name: Not on file  . Number of children: Not on file  . Years of education: Not on file  . Highest education level: Not on file  Occupational History  .  Not on file  Tobacco Use  . Smoking status: Never Smoker  . Smokeless tobacco: Never Used  Vaping Use  . Vaping Use: Never used  Substance and Sexual Activity  . Alcohol use: No  . Drug use: No  . Sexual activity: Not on file  Other Topics Concern  . Not on file  Social History Narrative  . Not on file   Social Determinants of Health   Financial Resource Strain: Not on file  Food Insecurity: Not on file  Transportation Needs: Not on file  Physical Activity: Not on file  Stress: Not on file  Social Connections: Not on file  Intimate Partner Violence: Not on file   Social History   Tobacco Use  Smoking Status Never Smoker  Smokeless Tobacco Never Used   Social History   Substance and Sexual Activity  Alcohol Use No    Family History:  Family History  Problem Relation Age of Onset  . Hypertension Mother   . Hypertension Father   . Cancer Neg Hx   . Diabetes Neg Hx   . COPD Neg Hx   . Heart disease Neg Hx   . Stroke Neg Hx     Past medical history, surgical history, medications, allergies, family history and social history reviewed with patient today and changes made to appropriate areas of the chart.   Review of Systems - {ros master:310782} All other ROS negative except what is listed above and in the HPI.      Objective:    There were no vitals taken for this visit.  Wt Readings from Last 3 Encounters:  10/17/19  268 lb (121.6 kg)  10/16/19 270 lb (122.5 kg)  08/19/19 274 lb (124.3 kg)    Physical Exam  Cognitive Testing - 6-CIT  Correct? Score   What year is it? {YES NO:22349} {Numbers; 0-4:31231} Yes = 0    No = 4  What month is it? {YES NO:22349} {Numbers; 0-4:31231} Yes = 0    No = 3  Remember:     Floyde Parkins, [address], [place]     What time is it? {YES NO:22349} {Numbers; 0-4:31231} Yes = 0    No = 3  Count backwards from 20 to 1 {YES NO:22349} {Numbers; 0-4:31231} Correct = 0    1 error = 2   More than 1 error = 4  Say the months of the year in reverse. {YES NO:22349} {Numbers; 0-4:31231} Correct = 0    1 error = 2   More than 1 error = 4  What address did I ask you to remember? {YES NO:22349} {NUMBERS; 0-10:5044} Correct = 0  1 error = 2    2 error = 4    3 error = 6    4 error = 8    All wrong = 10       TOTAL SCORE  {Numbers; 0-93:26712}/45   Interpretation:  {Desc; normal/abnormal:11317::"Normal"}  Normal (0-7) Abnormal (8-28)      Assessment & Plan:   Problem List Items Addressed This Visit   None      Preventative Services: Colon Cancer Screening: Up to Date Depression Screening: Up to Date Diabetes Screening: Ordered today Glaucoma Screening:  Hepatitis B vaccine: Hepatitis C screening: Chronic Hep C- Followed by GI HIV Screening: UP to Date Flu Vaccine: Lung cancer Screening: NA not a smoker Obesity Screening: Done Today Pneumonia Vaccines (2): STI Screening: PSA screening: Done Today  Discussed aspirin prophylaxis for myocardial  infarction prevention and decision was contineu Xarelto.  LABORATORY TESTING:  Health maintenance labs ordered today as discussed above.   The natural history of prostate cancer and ongoing controversy regarding screening and potential treatment outcomes of prostate cancer has been discussed with the patient. The meaning of a false positive PSA and a false negative PSA has been discussed. He indicates understanding of the  limitations of this screening test and wishes to proceed with screening PSA testing.   IMMUNIZATIONS:   - Tdap: Tetanus vaccination status reviewed: last tetanus booster within 10 years. - Influenza: {Blank single:19197::"Up to date","Administered today","Postponed to flu season","Refused","Given elsewhere"} - Pneumovax: {Blank single:19197::"Up to date","Administered today","Not applicable","Refused","Given elsewhere"} - Prevnar: Not applicable - Zostavax vaccine: {Blank single:19197::"Up to date","Administered today","Not applicable","Refused","Given elsewhere"}  SCREENING: - Colonoscopy: Up to date  Discussed with patient purpose of the colonoscopy is to detect colon cancer at curable precancerous or early stages    PATIENT COUNSELING:    Sexuality: Discussed sexually transmitted diseases, partner selection, use of condoms, avoidance of unintended pregnancy  and contraceptive alternatives.   Advised to avoid cigarette smoking.  I discussed with the patient that most people either abstain from alcohol or drink within safe limits (<=14/week and <=4 drinks/occasion for males, <=7/weeks and <= 3 drinks/occasion for females) and that the risk for alcohol disorders and other health effects rises proportionally with the number of drinks per week and how often a drinker exceeds daily limits.  Discussed cessation/primary prevention of drug use and availability of treatment for abuse.   Diet: Encouraged to adjust caloric intake to maintain  or achieve ideal body weight, to reduce intake of dietary saturated fat and total fat, to limit sodium intake by avoiding high sodium foods and not adding table salt, and to maintain adequate dietary potassium and calcium preferably from fresh fruits, vegetables, and low-fat dairy products.    stressed the importance of regular exercise  Injury prevention: Discussed safety belts, safety helmets, smoke detector, smoking near bedding or upholstery.   Dental  health: Discussed importance of regular tooth brushing, flossing, and dental visits.   Follow up plan: NEXT PREVENTATIVE PHYSICAL DUE IN 1 YEAR. No follow-ups on file.

## 2020-09-03 ENCOUNTER — Encounter: Payer: Self-pay | Admitting: Nurse Practitioner

## 2020-09-03 ENCOUNTER — Other Ambulatory Visit: Payer: Self-pay | Admitting: Nurse Practitioner

## 2020-09-03 NOTE — Telephone Encounter (Signed)
Requested medication (s) are due for refill today: yes  Requested medication (s) are on the active medication list: yes  Last refill:  05/18/2020  Future visit scheduled: no  Notes to clinic:  Patient no show appt on 08/22/2020 Review for refill    Requested Prescriptions  Pending Prescriptions Disp Refills   BIDIL 20-37.5 MG tablet [Pharmacy Med Name: BIDIL TABLETS] 270 tablet 0    Sig: TAKE 1 TABLET BY MOUTH THREE TIMES DAILY      Cardiovascular:  Vasodilators Failed - 09/03/2020  9:11 AM      Failed - WBC in normal range and within 360 days    WBC  Date Value Ref Range Status  10/16/2019 3.8 (L) 4.0 - 10.5 K/uL Final          Failed - Last BP in normal range    BP Readings from Last 1 Encounters:  10/17/19 (!) 180/130          Passed - HCT in normal range and within 360 days    HCT  Date Value Ref Range Status  10/16/2019 48.2 39.0 - 52.0 % Final   Hematocrit  Date Value Ref Range Status  08/19/2019 49.4 37.5 - 51.0 % Final          Passed - HGB in normal range and within 360 days    Hemoglobin  Date Value Ref Range Status  10/16/2019 16.2 13.0 - 17.0 g/dL Final  58/02/9832 82.5 13.0 - 17.7 g/dL Final          Passed - RBC in normal range and within 360 days    RBC  Date Value Ref Range Status  10/16/2019 5.12 4.22 - 5.81 MIL/uL Final          Passed - PLT in normal range and within 360 days    Platelets  Date Value Ref Range Status  10/16/2019 220 150 - 400 K/uL Final  08/19/2019 217 150 - 450 x10E3/uL Final          Passed - Valid encounter within last 12 months    Recent Outpatient Visits           10 months ago Acute on chronic left systolic heart failure [facility])   [facility] Particia Nearing, PA-C   1 year ago Acute on chronic left systolic heart failure [facility])   [facility] Particia Nearing, [place]   1 year ago Essential hypertension   [facility] Particia Nearing, [place]   2 years ago  Essential hypertension   [facility] Particia Nearing, [place]   2 years ago Essential hypertension   [facility] [place], PA-C                  BIDIL 20-37.5 MG tablet [Pharmacy Med Name: BIDIL TABLETS] 270 tablet 0    Sig: TAKE 1 TABLET BY MOUTH THREE TIMES DAILY      Cardiovascular:  Vasodilators Failed - 09/03/2020  9:11 AM      Failed - WBC in normal range and within 360 days    WBC  Date Value Ref Range Status  10/16/2019 3.8 (L) 4.0 - 10.5 K/uL Final          Failed - Last BP in normal range    BP Readings from Last 1 Encounters:  10/17/19 (!) 180/130          Passed - HCT in normal range and within 360 days  HCT  Date Value Ref Range Status  10/16/2019 48.2 39.0 - 52.0 % Final   Hematocrit  Date Value Ref Range Status  08/19/2019 49.4 37.5 - 51.0 % Final          Passed - HGB in normal range and within 360 days    Hemoglobin  Date Value Ref Range Status  10/16/2019 16.2 13.0 - 17.0 g/dL Final  22/48/2500 37.0 13.0 - 17.7 g/dL Final          Passed - RBC in normal range and within 360 days    RBC  Date Value Ref Range Status  10/16/2019 5.12 4.22 - 5.81 MIL/uL Final          Passed - PLT in normal range and within 360 days    Platelets  Date Value Ref Range Status  10/16/2019 220 150 - 400 K/uL Final  08/19/2019 217 150 - 450 x10E3/uL Final          Passed - Valid encounter within last 12 months    Recent Outpatient Visits           10 months ago Acute on chronic left systolic heart failure [facility])   [facility] [place], [place], [place]   1 year ago Acute on chronic left systolic heart failure [facility])   [facility] Particia Nearing, [place]   1 year ago Essential hypertension   [facility] Roosvelt Maser [place], [place]   2 years ago Essential hypertension   [facility] Roosvelt Maser [place], [place]   2 years ago Essential hypertension    [facility] Roosvelt Maser [place], [place]

## 2020-09-03 NOTE — Telephone Encounter (Signed)
Called pt number has been changed sent letter via [facility]

## 2020-09-10 NOTE — Telephone Encounter (Signed)
Called number still not valid

## 2020-09-19 DIAGNOSIS — I420 Dilated cardiomyopathy: Secondary | ICD-10-CM | POA: Diagnosis not present

## 2020-09-19 DIAGNOSIS — I071 Rheumatic tricuspid insufficiency: Secondary | ICD-10-CM | POA: Diagnosis not present

## 2020-09-19 DIAGNOSIS — R7309 Other abnormal glucose: Secondary | ICD-10-CM | POA: Diagnosis not present

## 2020-09-19 DIAGNOSIS — I10 Essential (primary) hypertension: Secondary | ICD-10-CM | POA: Diagnosis not present

## 2020-09-19 DIAGNOSIS — B182 Chronic viral hepatitis C: Secondary | ICD-10-CM | POA: Diagnosis not present

## 2020-09-19 DIAGNOSIS — I340 Nonrheumatic mitral (valve) insufficiency: Secondary | ICD-10-CM | POA: Diagnosis not present

## 2020-09-19 DIAGNOSIS — Z114 Encounter for screening for human immunodeficiency virus [HIV]: Secondary | ICD-10-CM | POA: Diagnosis not present

## 2020-09-19 DIAGNOSIS — R972 Elevated prostate specific antigen [PSA]: Secondary | ICD-10-CM | POA: Diagnosis not present

## 2020-09-19 DIAGNOSIS — I4819 Other persistent atrial fibrillation: Secondary | ICD-10-CM | POA: Diagnosis not present

## 2020-09-21 ENCOUNTER — Encounter: Payer: Self-pay | Admitting: Nurse Practitioner

## 2020-09-21 NOTE — Telephone Encounter (Signed)
Number still not available

## 2020-09-26 DIAGNOSIS — I071 Rheumatic tricuspid insufficiency: Secondary | ICD-10-CM | POA: Diagnosis not present

## 2020-09-26 DIAGNOSIS — R0600 Dyspnea, unspecified: Secondary | ICD-10-CM | POA: Diagnosis not present

## 2020-10-04 DIAGNOSIS — I10 Essential (primary) hypertension: Secondary | ICD-10-CM | POA: Diagnosis not present

## 2020-10-04 DIAGNOSIS — I340 Nonrheumatic mitral (valve) insufficiency: Secondary | ICD-10-CM | POA: Diagnosis not present

## 2020-10-04 DIAGNOSIS — I4819 Other persistent atrial fibrillation: Secondary | ICD-10-CM | POA: Diagnosis not present

## 2020-10-04 DIAGNOSIS — I420 Dilated cardiomyopathy: Secondary | ICD-10-CM | POA: Diagnosis not present

## 2020-12-20 IMAGING — DX DG CHEST 2V
2 series · 2 of 2 positions shown · non-contrast
Comparison: March 29, 2016

CLINICAL DATA: Chest pain.  Shortness of breath.

EXAM:
CHEST - 2 VIEW

[chest pa]
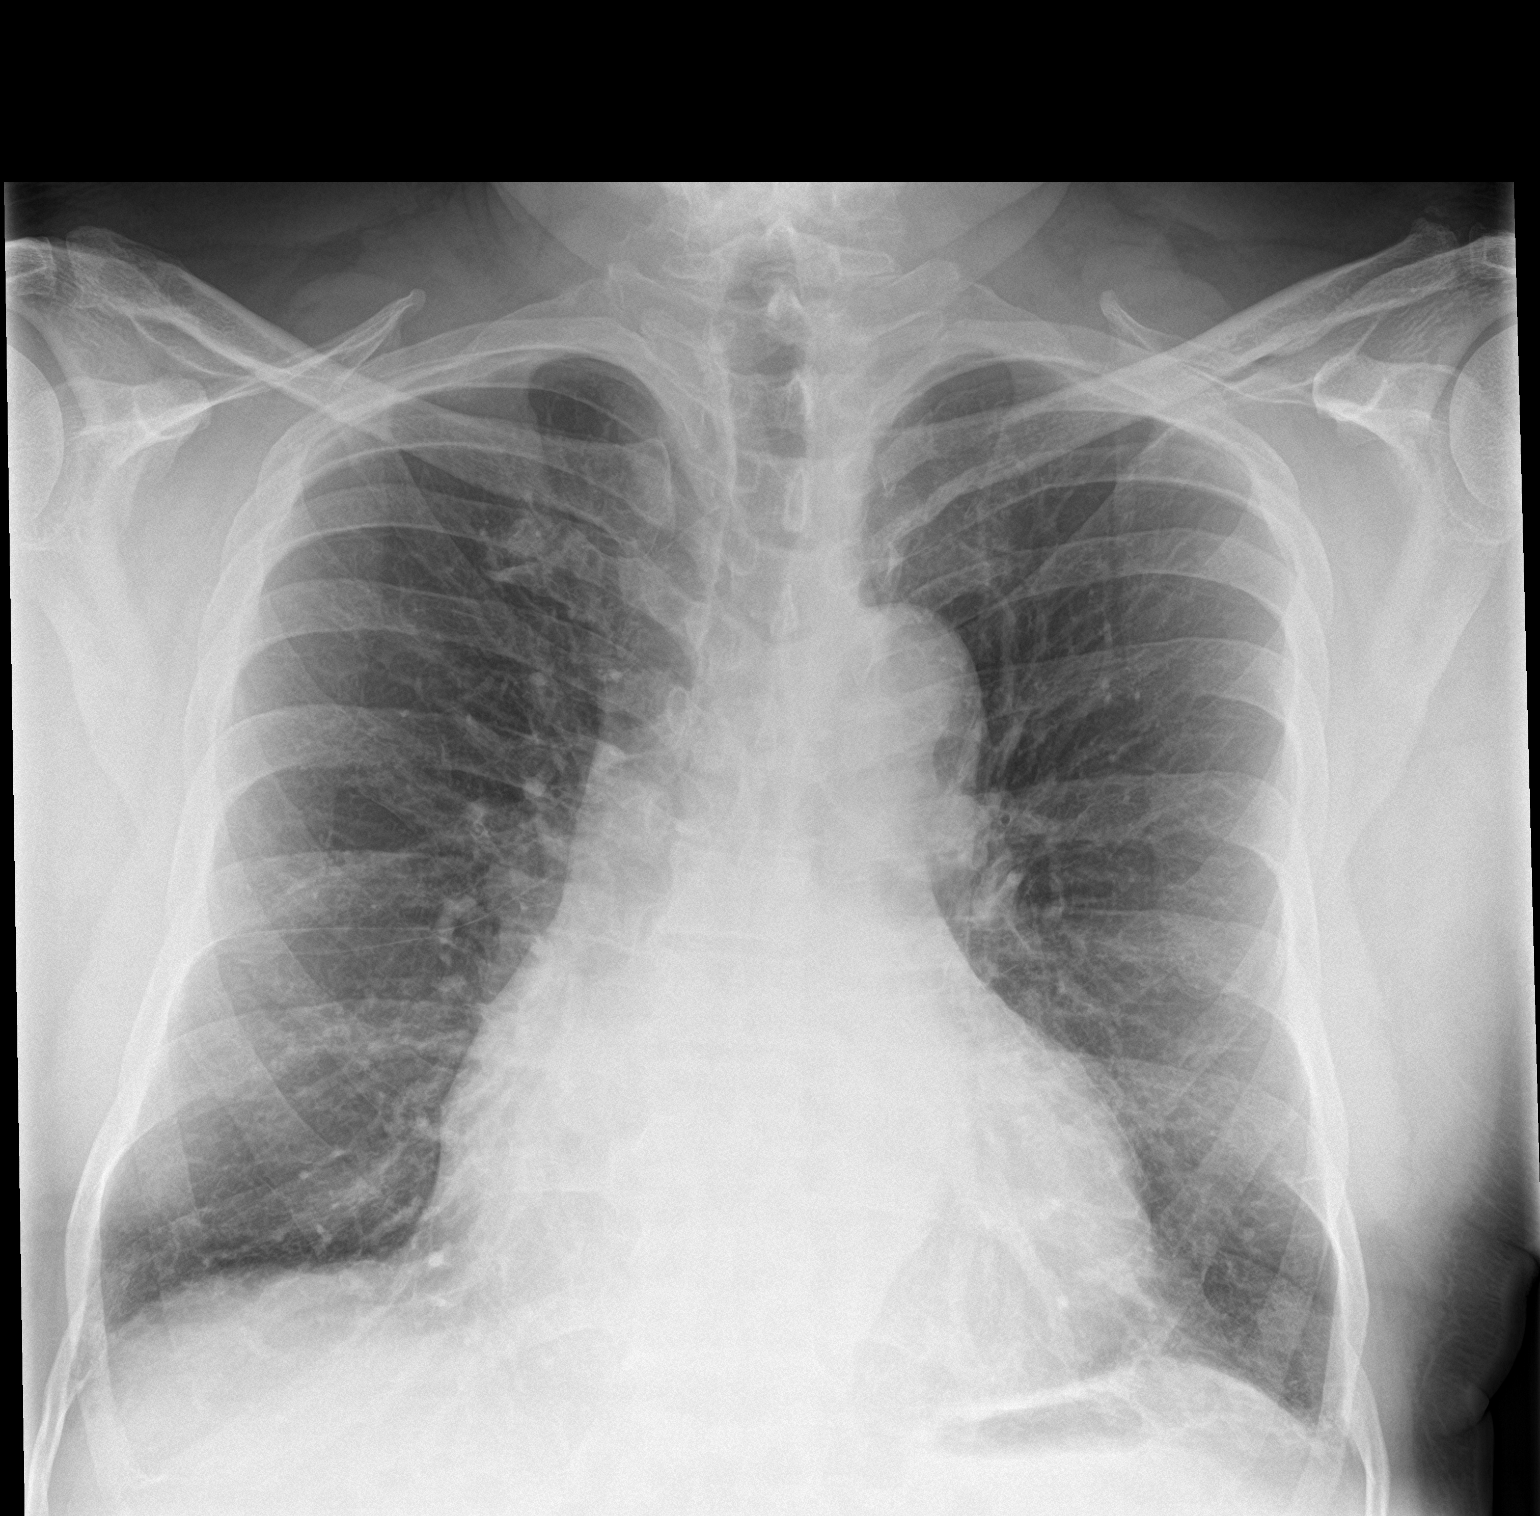

[chest lat]
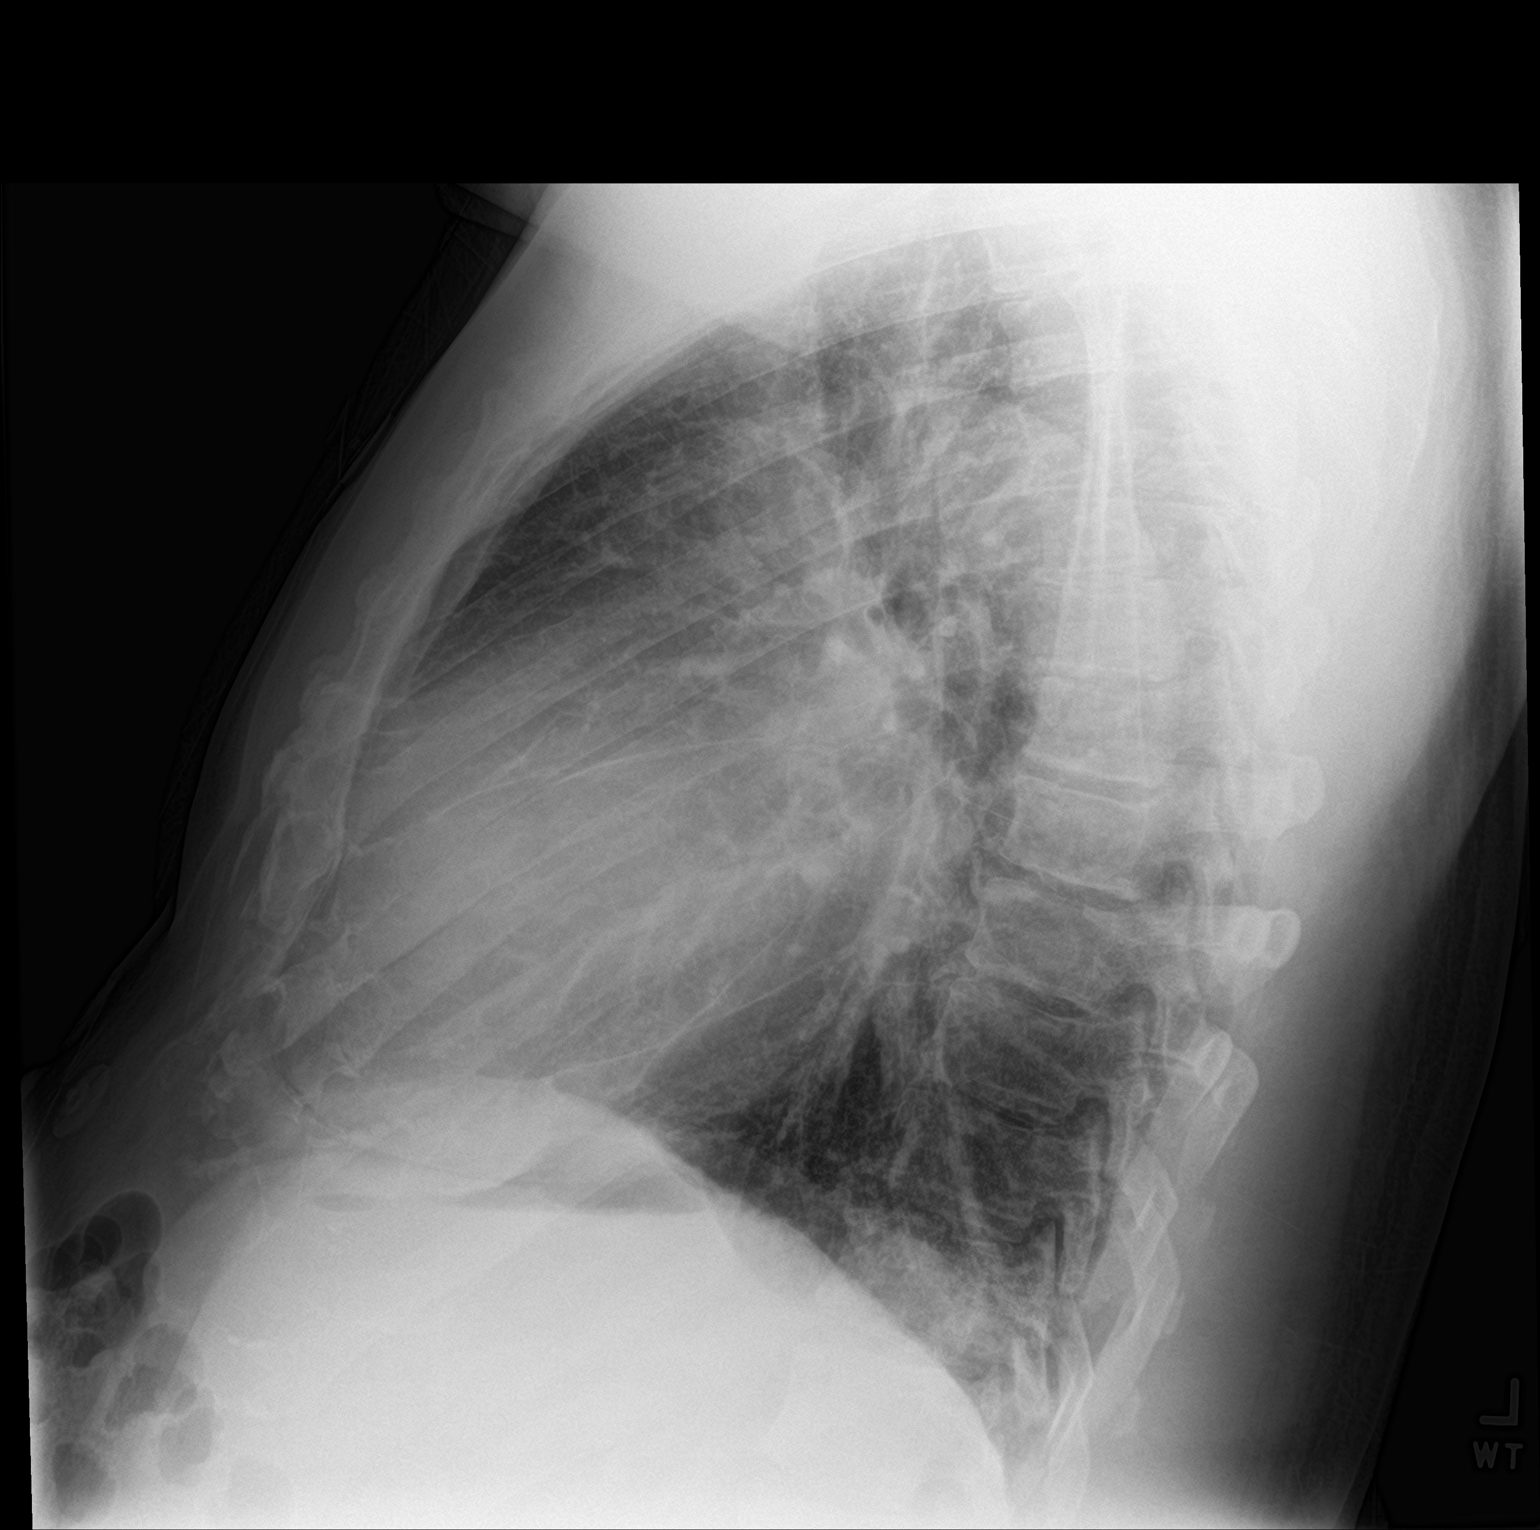

[2 of 2 positions shown; findings below may reference images not displayed]

FINDINGS: Stable cardiomegaly. The hila and mediastinum are normal. No
pneumothorax. No pulmonary nodules or masses. No focal infiltrates.
No other acute abnormalities.
IMPRESSION: No active cardiopulmonary disease.

## 2020-12-25 DIAGNOSIS — I071 Rheumatic tricuspid insufficiency: Secondary | ICD-10-CM | POA: Diagnosis not present

## 2020-12-25 DIAGNOSIS — I4819 Other persistent atrial fibrillation: Secondary | ICD-10-CM | POA: Diagnosis not present

## 2020-12-25 DIAGNOSIS — I10 Essential (primary) hypertension: Secondary | ICD-10-CM | POA: Diagnosis not present

## 2020-12-25 DIAGNOSIS — I420 Dilated cardiomyopathy: Secondary | ICD-10-CM | POA: Diagnosis not present

## 2021-01-04 DIAGNOSIS — K047 Periapical abscess without sinus: Secondary | ICD-10-CM | POA: Diagnosis not present

## 2021-01-07 DIAGNOSIS — I4811 Longstanding persistent atrial fibrillation: Secondary | ICD-10-CM | POA: Diagnosis not present

## 2021-03-07 ENCOUNTER — Telehealth: Payer: Self-pay | Admitting: Nurse Practitioner

## 2021-07-20 ENCOUNTER — Encounter (HOSPITAL_COMMUNITY): Payer: Self-pay | Admitting: Emergency Medicine

## 2021-07-20 ENCOUNTER — Other Ambulatory Visit: Payer: Self-pay

## 2021-07-20 ENCOUNTER — Emergency Department (HOSPITAL_COMMUNITY)
Admission: EM | Admit: 2021-07-20 | Discharge: 2021-07-20 | Disposition: A | Discharge status: Home / Self Care | Payer: Medicaid Other | Attending: Emergency Medicine | Admitting: Emergency Medicine

## 2021-07-20 DIAGNOSIS — M25421 Effusion, right elbow: Secondary | ICD-10-CM | POA: Diagnosis not present

## 2021-07-20 DIAGNOSIS — M7021 Olecranon bursitis, right elbow: Secondary | ICD-10-CM

## 2021-07-20 DIAGNOSIS — R2231 Localized swelling, mass and lump, right upper limb: Secondary | ICD-10-CM | POA: Diagnosis present

## 2021-07-20 DIAGNOSIS — Y9389 Activity, other specified: Secondary | ICD-10-CM | POA: Insufficient documentation

## 2021-07-20 DIAGNOSIS — I110 Hypertensive heart disease with heart failure: Secondary | ICD-10-CM | POA: Insufficient documentation

## 2021-07-20 DIAGNOSIS — I4891 Unspecified atrial fibrillation: Secondary | ICD-10-CM | POA: Insufficient documentation

## 2021-07-20 DIAGNOSIS — I5023 Acute on chronic systolic (congestive) heart failure: Secondary | ICD-10-CM | POA: Insufficient documentation

## 2021-07-20 NOTE — Discharge Instructions (Signed)
You were seen and evaluated in our emergency department for elbow swelling.  Leading diagnosis at this time is bursitis.  Will recommend rest ice compression and elevation.  We will also recommend follow-up with orthopedic surgery if you are continue having right elbow swelling.  Referral placed.

## 2021-07-20 NOTE — ED Provider Notes (Signed)
[facility] Emergency Department Provider Note MRN:  [id number]  Arrival date & time: 07/20/21     Chief Complaint   Joint Swelling   History of Present Illness   Dwayne Ramirez is a 61 y.o. year-old male with a history of A Fib, CHF, Dilated cardiomyopathy, Hep C, HTN presenting to the ED with chief complaint of right elbow swelling.  Patient states that he has never had joint swelling before.  Patient is that he has no fevers or chills.  The patient states that he frequently drives in his car where he rested his right elbow on the console.  He noticed that it was swollen this morning.  He states that he has no pain with manipulation of his right elbow.  He has suffered no trauma to his right elbow.  He denies any IV drug use.  Review of Systems  A thorough review of systems was obtained and all systems are negative except as noted in the HPI and PMH.   Patient's Health History    Past Medical History:  Diagnosis Date   A-fib [facility])    Acute on chronic left systolic heart failure ([place]) 03/2016   Dilated cardiomyopathy ([place])    Hypertension    Obesity     History reviewed. No pertinent surgical history.  Family History  Problem Relation Age of Onset   Hypertension Mother    Hypertension Father    Cancer Neg Hx    Diabetes Neg Hx    COPD Neg Hx    Heart disease Neg Hx    Stroke Neg Hx     Social History   Socioeconomic History   Marital status: Single    Spouse name: Not on file   Number of children: Not on file   Years of education: Not on file   Highest education level: Not on file  Occupational History   Not on file  Tobacco Use   Smoking status: Never   Smokeless tobacco: Never  Vaping Use   Vaping Use: Never used  Substance and Sexual Activity   Alcohol use: No   Drug use: No   Sexual activity: Not on file  Other Topics Concern   Not on file  Social History Narrative   Not on file   Social Determinants of Health   Financial  Resource Strain: Not on file  Food Insecurity: Not on file  Transportation Needs: Not on file  Physical Activity: Not on file  Stress: Not on file  Social Connections: Not on file  Intimate Partner Violence: Not on file     Physical Exam   Physical Exam Vitals and nursing note reviewed.  Constitutional:      Appearance: Normal appearance.  Cardiovascular:     Rate and Rhythm: Normal rate and regular rhythm.  Pulmonary:     Effort: Pulmonary effort is normal.     Breath sounds: Normal breath sounds.  Abdominal:     General: Abdomen is flat.     Palpations: Abdomen is soft.  Musculoskeletal:        General: Swelling (Right elbow) present. No tenderness (no pain with manipulation), deformity or signs of injury.  Skin:    Capillary Refill: Capillary refill takes less than 2 seconds.     Findings: No erythema.  Neurological:     General: No focal deficit present.     Mental Status: He is alert and oriented to person, place, and time. Mental status is at baseline.  Psychiatric:  Mood and Affect: Mood normal.      Diagnostic and Interventional Summary    Procedures  /  Critical Care Procedures  ED Course and Medical Decision Making  Initial Impression and Ddx 61 year old male presents to emergency department for evaluation of right elbow swelling.  Differential diagnosis includes but sounds to the following: Bursitis, septic bursitis, gout.  Most likely diagnosis at this time is bursitis.  Doubt septic or infectious causes given that there is no warmth greater on the right than left elbow and no overlying erythema.  He has no pain with manipulation of the right elbow.  Past medical/surgical history that increases complexity of ED encounter: Coronary artery disease, dilated cardiomyopathy, hypertension  Interpretation of Diagnostics I personally reviewed the Cardiac Monitor and my interpretation is as follows: Sinus rhythm on the cardiac monitor    Bedside ultrasound  was performed which did not reveal any intra-articular fluid.  No abscess formation of the elbow.  This is consistent with a diagnosis of bursitis.  Patient Reassessment and Ultimate Disposition/Management Discussion was had with the patient on the benefits/negatives of using a short course of ibuprofen to treat his bursitis.  Patient was instructed to use RICE therapy and to wrap arm and an Ace bandage.  Referral was placed to orthopedic surgery.  Patient was discharged to self-care with strict return precautions.  Patient management required discussion with the following services or consulting groups:  None  Complexity of Problems Addressed Acute complicated illness or Injury  Additional Data Reviewed and Analyzed Further history obtained from: Recent PCP notes and Recent Consult notes  Factors Impacting ED Encounter Risk None    Final Clinical Impressions(s) / ED Diagnoses     ICD-10-CM   1. Olecranon bursitis of right elbow  M70.21     2. Elbow swelling, right  M25.421       ED Discharge Orders          Ordered    Ambulatory referral to Orthopedic Surgery       Comments: Olecranon bursitis   07/20/21 1737             Discharge Instructions Discussed with and Provided to Patient:     Discharge Instructions      You were seen and evaluated in our emergency department for elbow swelling.  Leading diagnosis at this time is bursitis.  Will recommend rest ice compression and elevation.  We will also recommend follow-up with orthopedic surgery if you are continue having right elbow swelling.  Referral placed.        Zachery Dakins, MD 07/20/21 2230    Lucrezia Starch, MD 07/21/21 [phone]

## 2021-07-20 NOTE — ED Triage Notes (Signed)
Pt arrives POV, with swelling to R elbow joint. States he went to bed with it normal last night and woke up to swelling. Pt used ice today and it helped decrease swelling some. No pain from area. Full ROM. Denies fevers chills. NAD. VSS.

## 2021-07-22 ENCOUNTER — Telehealth: Payer: Self-pay

## 2021-07-22 NOTE — Telephone Encounter (Signed)
Transition Care Management Follow-up Telephone Call Date of discharge and from where: 07/20/2021 from [facility] How have you been since you were released from the hospital? Pt stated that he is feeling well and did not have any questions at this time. Pt stated that he will Ortho today to schedule follow up appt.  Any questions or concerns? No  Items Reviewed: Did the pt receive and understand the discharge instructions provided? Yes  Medications obtained and verified? Yes  Other? No  Any new allergies since your discharge? No  Dietary orders reviewed? No Do you have support at home? Yes   Functional Questionnaire: (I = Independent and D = Dependent) ADLs: I  Bathing/Dressing- I  Meal Prep- I  Eating- I  Maintaining continence- I  Transferring/Ambulation- I  Managing Meds- I   Follow up appointments reviewed:  PCP Hospital f/u appt confirmed? No   Specialist Hospital f/u appt confirmed? No   Are transportation arrangements needed? No  If their condition worsens, is the pt aware to call PCP or go to the Emergency Dept.? Yes Was the patient provided with contact information for the PCP's office or ED? Yes Was to pt encouraged to call back with questions or concerns? Yes

## 2021-07-24 DIAGNOSIS — Z419 Encounter for procedure for purposes other than remedying health state, unspecified: Secondary | ICD-10-CM | POA: Diagnosis not present

## 2021-07-25 ENCOUNTER — Ambulatory Visit: Payer: Medicaid Other | Admitting: Orthopedic Surgery

## 2021-08-21 DIAGNOSIS — Z419 Encounter for procedure for purposes other than remedying health state, unspecified: Secondary | ICD-10-CM | POA: Diagnosis not present

## 2021-08-23 ENCOUNTER — Encounter: Payer: Medicaid Other | Admitting: Nurse Practitioner

## 2021-08-23 NOTE — Progress Notes (Unsigned)
There were no vitals taken for this visit.   Subjective:    Patient ID: Dwayne Ramirez, male    DOB: 06-Mar-1961, 61 y.o.   MRN: [id number]  HPI: Dwayne Ramirez is a 61 y.o. male presenting on 08/23/2021 for comprehensive medical examination. Current medical complaints include:{Blank single:19197::"none","***"}  He currently lives with: Interim Problems from his last visit: {Blank single:19197::"yes","no"}  HEART FAILURE  HEPATITIS C  Depression Screen done today and results listed below:  Depression screen [facility] 2/9 08/19/2019 02/09/2018 01/15/2017  Decreased Interest 3 3 0  Down, Depressed, Hopeless 0 0 0  PHQ - 2 Score 3 3 0  Altered sleeping 0 3 -  Tired, decreased energy 3 3 -  Change in appetite 0 3 -  Feeling bad or failure about yourself  0 0 -  Trouble concentrating 0 0 -  Moving slowly or fidgety/restless 0 0 -  Suicidal thoughts 0 0 -  PHQ-9 Score 6 12 -  Difficult doing work/chores - Somewhat difficult -    The patient {has/does not JN:[id number] a history of falls. I {did/did not:19850} complete a risk assessment for falls. A plan of care for falls {was/was not:19852} documented.   Past Medical History:  Past Medical History:  Diagnosis Date   A-fib ([place])    Acute on chronic left systolic heart failure ([place]) 03/2016   Dilated cardiomyopathy ([place])    Hypertension    Obesity     Surgical History:  No past surgical history on file.  Medications:  Current Outpatient Medications on File Prior to Visit  Medication Sig   BIDIL 20-37.5 MG tablet TAKE 1 TABLET BY MOUTH THREE TIMES DAILY   cloNIDine (CATAPRES) 0.2 MG tablet Take 1 tablet (0.2 mg total) by mouth 2 (two) times daily.   diltiazem (CARDIZEM SR) 90 MG 12 hr capsule TAKE 1 CAPSULE(90 MG) BY MOUTH TWICE DAILY   furosemide (LASIX) 40 MG tablet Take 1 tablet (40 mg total) by mouth daily.   losartan (COZAAR) 100 MG tablet Take 1 tablet (100 mg total) by mouth daily.   metoprolol tartrate (LOPRESSOR) 25 MG tablet  Take 1 tablet (25 mg total) by mouth 2 (two) times daily.   Rivaroxaban (XARELTO) 15 MG TABS tablet Take 1 tablet (15 mg total) by mouth daily.   spironolactone (ALDACTONE) 25 MG tablet TAKE 1/2 TABLET BY MOUTH TWICE DAILY   No current facility-administered medications on file prior to visit.    Allergies:  No Known Allergies  Social History:  Social History   Socioeconomic History   Marital status: Single    Spouse name: Not on file   Number of children: Not on file   Years of education: Not on file   Highest education level: Not on file  Occupational History   Not on file  Tobacco Use   Smoking status: Never   Smokeless tobacco: Never  Vaping Use   Vaping Use: Never used  Substance and Sexual Activity   Alcohol use: No   Drug use: No   Sexual activity: Not on file  Other Topics Concern   Not on file  Social History Narrative   Not on file   Social Determinants of Health   Financial Resource Strain: Not on file  Food Insecurity: Not on file  Transportation Needs: Not on file  Physical Activity: Not on file  Stress: Not on file  Social Connections: Not on file  Intimate Partner Violence: Not on file   Social History   Tobacco  Use  Smoking Status Never  Smokeless Tobacco Never   Social History   Substance and Sexual Activity  Alcohol Use No    Family History:  Family History  Problem Relation Age of Onset   Hypertension Mother    Hypertension Father    Cancer Neg Hx    Diabetes Neg Hx    COPD Neg Hx    Heart disease Neg Hx    Stroke Neg Hx     Past medical history, surgical history, medications, allergies, family history and social history reviewed with patient today and changes made to appropriate areas of the chart.   ROS All other ROS negative except what is listed above and in the HPI.      Objective:    There were no vitals taken for this visit.  Wt Readings from Last 3 Encounters:  07/20/21 268 lb (121.6 kg)  10/17/19 268 lb (121.6  kg)  10/16/19 270 lb (122.5 kg)    Physical Exam  Results for orders placed or performed during the hospital encounter of 12/01/19  NM Myocar Multi W/Spect W/Wall Motion / EF  Result Value Ref Range   SSS 2    SRS 13    SDS 1    TID 1.02    LV sys vol 110 mL   LV dias vol 218 62 - 150 mL   Rest HR 63 bpm   Rest BP 113/73 mmHg   Exercise duration (sec) 15 sec   Percent HR 49 %   Exercise duration (min) 1 min   Estimated workload 1.0 METS   Peak HR 80 bpm   Peak BP 139/112 mmHg      Assessment & Plan:   Problem List Items Addressed This Visit       Cardiovascular and Mediastinum   Essential (primary) hypertension - Primary   Dilated cardiomyopathy (HCC)   Chronic HFrEF (heart failure with reduced ejection fraction) (HCC)     Digestive   Hepatitis C, chronic (HCC)     Discussed aspirin prophylaxis for myocardial infarction prevention and decision was {Blank single:19197::"it was not indicated","made to continue ASA","made to start ASA","made to stop ASA","that we recommended ASA, and patient refused"}  LABORATORY TESTING:  Health maintenance labs ordered today as discussed above.   The natural history of prostate cancer and ongoing controversy regarding screening and potential treatment outcomes of prostate cancer has been discussed with the patient. The meaning of a false positive PSA and a false negative PSA has been discussed. He indicates understanding of the limitations of this screening test and wishes *** to proceed with screening PSA testing.   IMMUNIZATIONS:   - Tdap: Tetanus vaccination status reviewed: {tetanus status:315746}. - Influenza: {Blank single:19197::"Up to date","Administered today","Postponed to flu season","Refused","Given elsewhere"} - Pneumovax: {Blank single:19197::"Up to date","Administered today","Not applicable","Refused","Given elsewhere"} - Prevnar: {Blank single:19197::"Up to date","Administered today","Not applicable","Refused","Given  elsewhere"} - COVID: {Blank single:19197::"Up to date","Administered today","Not applicable","Refused","Given elsewhere"} - HPV: {Blank single:19197::"Up to date","Administered today","Not applicable","Refused","Given elsewhere"} - Shingrix vaccine: {Blank single:19197::"Up to date","Administered today","Not applicable","Refused","Given elsewhere"}  SCREENING: - Colonoscopy: {Blank single:19197::"Up to date","Ordered today","Not applicable","Refused","Done elsewhere"}  Discussed with patient purpose of the colonoscopy is to detect colon cancer at curable precancerous or early stages   - AAA Screening: {Blank single:19197::"Up to date","Ordered today","Not applicable","Refused","Done elsewhere"}  -Hearing Test: {Blank single:19197::"Up to date","Ordered today","Not applicable","Refused","Done elsewhere"}  -Spirometry: {Blank single:19197::"Up to date","Ordered today","Not applicable","Refused","Done elsewhere"}   PATIENT COUNSELING:    Sexuality: Discussed sexually transmitted diseases, partner selection, use of condoms, avoidance of  unintended pregnancy  and contraceptive alternatives.   Advised to avoid cigarette smoking.  I discussed with the patient that most people either abstain from alcohol or drink within safe limits (<=14/week and <=4 drinks/occasion for males, <=7/weeks and <= 3 drinks/occasion for females) and that the risk for alcohol disorders and other health effects rises proportionally with the number of drinks per week and how often a drinker exceeds daily limits.  Discussed cessation/primary prevention of drug use and availability of treatment for abuse.   Diet: Encouraged to adjust caloric intake to maintain  or achieve ideal body weight, to reduce intake of dietary saturated fat and total fat, to limit sodium intake by avoiding high sodium foods and not adding table salt, and to maintain adequate dietary potassium and calcium preferably from fresh fruits, vegetables, and  low-fat dairy products.    stressed the importance of regular exercise  Injury prevention: Discussed safety belts, safety helmets, smoke detector, smoking near bedding or upholstery.   Dental health: Discussed importance of regular tooth brushing, flossing, and dental visits.   Follow up plan: NEXT PREVENTATIVE PHYSICAL DUE IN 1 YEAR. No follow-ups on file.

## 2021-09-02 ENCOUNTER — Ambulatory Visit (INDEPENDENT_AMBULATORY_CARE_PROVIDER_SITE_OTHER): Payer: Medicaid Other | Admitting: Nurse Practitioner

## 2021-09-02 ENCOUNTER — Encounter: Payer: Self-pay | Admitting: Nurse Practitioner

## 2021-09-02 ENCOUNTER — Other Ambulatory Visit: Payer: Self-pay

## 2021-09-02 VITALS — BP 115/76 | HR 67 | Temp 98.9°F | Ht 75.0 in | Wt 267.4 lb

## 2021-09-02 DIAGNOSIS — I4811 Longstanding persistent atrial fibrillation: Secondary | ICD-10-CM

## 2021-09-02 DIAGNOSIS — I5022 Chronic systolic (congestive) heart failure: Secondary | ICD-10-CM | POA: Diagnosis not present

## 2021-09-02 DIAGNOSIS — I420 Dilated cardiomyopathy: Secondary | ICD-10-CM

## 2021-09-02 DIAGNOSIS — Z136 Encounter for screening for cardiovascular disorders: Secondary | ICD-10-CM | POA: Diagnosis not present

## 2021-09-02 DIAGNOSIS — I10 Essential (primary) hypertension: Secondary | ICD-10-CM

## 2021-09-02 DIAGNOSIS — I2089 Other forms of angina pectoris: Secondary | ICD-10-CM | POA: Insufficient documentation

## 2021-09-02 DIAGNOSIS — I208 Other forms of angina pectoris: Secondary | ICD-10-CM | POA: Diagnosis not present

## 2021-09-02 DIAGNOSIS — R8290 Unspecified abnormal findings in urine: Secondary | ICD-10-CM

## 2021-09-02 DIAGNOSIS — Z0000 Encounter for general adult medical examination without abnormal findings: Secondary | ICD-10-CM | POA: Diagnosis not present

## 2021-09-02 LAB — URINALYSIS, ROUTINE W REFLEX MICROSCOPIC
Bilirubin, UA: NEGATIVE
Glucose, UA: NEGATIVE
Nitrite, UA: NEGATIVE
RBC, UA: NEGATIVE
Specific Gravity, UA: >1.03 — ABNORMAL HIGH (ref 1.005–1.030)
Urobilinogen, Ur: 0.2 mg/dL (ref 0.2–1.0)
pH, UA: 5.5 (ref 5.0–7.5)

## 2021-09-02 LAB — MICROSCOPIC EXAMINATION
Bacteria, UA: NONE SEEN
Epithelial Cells (non renal): NONE SEEN /hpf (ref 0–10)
RBC, Urine: NONE SEEN /hpf (ref 0–2)

## 2021-09-02 NOTE — Assessment & Plan Note (Signed)
Chronic.  Controlled.  Continue with current medication regimen.  Labs ordered today.  Return to clinic in 6 months for reevaluation.  Call sooner if concerns arise.  ? ?

## 2021-09-02 NOTE — Progress Notes (Signed)
BP 115/76    Pulse 67    Temp 98.9 F (37.2 C)    Ht 6\' 3"  (1.905 m)    Wt 267 lb 6.4 oz (121.3 kg)    SpO2 99%    BMI 33.42 kg/m    Subjective:    Patient ID: Dwayne Ramirez, male    DOB: 12-03-1960, 61 y.o.   MRN: [id number]  HPI: Dwayne Ramirez is a 61 y.o. male presenting on 09/02/2021 for comprehensive medical examination. Current medical complaints include:none  He currently lives with: Interim Problems from his last visit: no  Patient has recently received insurance. He went several months without medication.  He is not out of his medications at this time.  Denies HA, CP, SOB, dizziness, palpitations, visual changes, and lower extremity swelling.   Depression Screen done today and results listed below:  Depression screen [facility] 2/9 09/02/2021 08/19/2019 02/09/2018 01/15/2017  Decreased Interest 3 3 3  0  Down, Depressed, Hopeless 0 0 0 0  PHQ - 2 Score 3 3 3  0  Altered sleeping 0 0 3 -  Tired, decreased energy 3 3 3  -  Change in appetite 0 0 3 -  Feeling bad or failure about yourself  0 0 0 -  Trouble concentrating 0 0 0 -  Moving slowly or fidgety/restless 0 0 0 -  Suicidal thoughts 0 0 0 -  PHQ-9 Score 6 6 12  -  Difficult doing work/chores - - Somewhat difficult -    The patient does not have a history of falls. I did complete a risk assessment for falls. A plan of care for falls was documented.   Past Medical History:  Past Medical History:  Diagnosis Date   A-fib ([place])    Acute on chronic left systolic heart failure ([place]) 03/2016   Dilated cardiomyopathy ([place])    Hypertension    Obesity     Surgical History:  History reviewed. No pertinent surgical history.  Medications:  Current Outpatient Medications on File Prior to Visit  Medication Sig   BIDIL 20-37.5 MG tablet TAKE 1 TABLET BY MOUTH THREE TIMES DAILY   cloNIDine (CATAPRES) 0.2 MG tablet Take 1 tablet (0.2 mg total) by mouth 2 (two) times daily.   diltiazem (CARDIZEM SR) 90 MG 12 hr capsule TAKE 1 CAPSULE(90  MG) BY MOUTH TWICE DAILY   furosemide (LASIX) 40 MG tablet Take 1 tablet (40 mg total) by mouth daily.   losartan (COZAAR) 100 MG tablet Take 1 tablet (100 mg total) by mouth daily.   metoprolol tartrate (LOPRESSOR) 25 MG tablet Take 1 tablet (25 mg total) by mouth 2 (two) times daily.   Rivaroxaban (XARELTO) 15 MG TABS tablet Take 1 tablet (15 mg total) by mouth daily.   spironolactone (ALDACTONE) 25 MG tablet TAKE 1/2 TABLET BY MOUTH TWICE DAILY   No current facility-administered medications on file prior to visit.    Allergies:  No Known Allergies  Social History:  Social History   Socioeconomic History   Marital status: Single    Spouse name: Not on file   Number of children: Not on file   Years of education: Not on file   Highest education level: Not on file  Occupational History   Not on file  Tobacco Use   Smoking status: Never   Smokeless tobacco: Never  Vaping Use   Vaping Use: Never used  Substance and Sexual Activity   Alcohol use: No   Drug use: No   Sexual activity: Not  Currently  Other Topics Concern   Not on file  Social History Narrative   Not on file   Social Determinants of Health   Financial Resource Strain: Not on file  Food Insecurity: Not on file  Transportation Needs: Not on file  Physical Activity: Not on file  Stress: Not on file  Social Connections: Not on file  Intimate Partner Violence: Not on file   Social History   Tobacco Use  Smoking Status Never  Smokeless Tobacco Never   Social History   Substance and Sexual Activity  Alcohol Use No    Family History:  Family History  Problem Relation Age of Onset   Hypertension Mother    Hypertension Father    Cancer Neg Hx    Diabetes Neg Hx    COPD Neg Hx    Heart disease Neg Hx    Stroke Neg Hx     Past medical history, surgical history, medications, allergies, family history and social history reviewed with patient today and changes made to appropriate areas of the chart.    Review of Systems  Eyes:  Negative for blurred vision and double vision.  Respiratory:  Negative for shortness of breath.   Cardiovascular:  Negative for chest pain, palpitations and leg swelling.  Neurological:  Negative for dizziness and headaches.  All other ROS negative except what is listed above and in the HPI.      Objective:    BP 115/76    Pulse 67    Temp 98.9 F (37.2 C)    Ht 6\' 3"  (1.905 m)    Wt 267 lb 6.4 oz (121.3 kg)    SpO2 99%    BMI 33.42 kg/m   Wt Readings from Last 3 Encounters:  09/02/21 267 lb 6.4 oz (121.3 kg)  07/20/21 268 lb (121.6 kg)  10/17/19 268 lb (121.6 kg)    Physical Exam Vitals and nursing note reviewed.  Constitutional:      General: He is not in acute distress.    Appearance: Normal appearance. He is not ill-appearing, toxic-appearing or diaphoretic.  HENT:     Head: Normocephalic.     Right Ear: Tympanic membrane, ear canal and external ear normal.     Left Ear: Tympanic membrane, ear canal and external ear normal.     Nose: Nose normal. No congestion or rhinorrhea.     Mouth/Throat:     Mouth: Mucous membranes are moist.  Eyes:     General:        Right eye: No discharge.        Left eye: No discharge.     Extraocular Movements: Extraocular movements intact.     Conjunctiva/sclera: Conjunctivae normal.     Pupils: Pupils are equal, round, and reactive to light.  Cardiovascular:     Rate and Rhythm: Normal rate and regular rhythm.     Heart sounds: No murmur heard. Pulmonary:     Effort: Pulmonary effort is normal. No respiratory distress.     Breath sounds: Normal breath sounds. No wheezing, rhonchi or rales.  Abdominal:     General: Abdomen is flat. Bowel sounds are normal. There is no distension.     Palpations: Abdomen is soft.     Tenderness: There is no abdominal tenderness. There is no guarding.  Musculoskeletal:     Cervical back: Normal range of motion and neck supple.  Skin:    General: Skin is warm and dry.      Capillary Refill:  Capillary refill takes less than 2 seconds.  Neurological:     General: No focal deficit present.     Mental Status: He is alert and oriented to person, place, and time.     Cranial Nerves: No cranial nerve deficit.     Motor: No weakness.     Deep Tendon Reflexes: Reflexes normal.  Psychiatric:        Mood and Affect: Mood normal.        Behavior: Behavior normal.        Thought Content: Thought content normal.        Judgment: Judgment normal.    Results for orders placed or performed during the hospital encounter of 12/01/19  NM Myocar Multi W/Spect W/Wall Motion / EF  Result Value Ref Range   SSS 2    SRS 13    SDS 1    TID 1.02    LV sys vol 110 mL   LV dias vol 218 62 - 150 mL   Rest HR 63 bpm   Rest BP 113/73 mmHg   Exercise duration (sec) 15 sec   Percent HR 49 %   Exercise duration (min) 1 min   Estimated workload 1.0 METS   Peak HR 80 bpm   Peak BP 139/112 mmHg      Assessment & Plan:   Problem List Items Addressed This Visit       Cardiovascular and Mediastinum   Essential (primary) hypertension    Chronic.  Controlled.  Continue with current medication regimen.  Labs ordered today.  Return to clinic in 6 months for reevaluation.  Call sooner if concerns arise.        Dilated cardiomyopathy (HCC)    Chronic. Has not followed up with Cardiology Since September 2022.  Encouraged patient to follow up with them for ongoing management. Return in 6 months for reevaluation.       Chronic HFrEF (heart failure with reduced ejection fraction) (HCC)    Chronic. Has not followed up with Cardiology Since September 2022.  Encouraged patient to follow up with them for ongoing management. Return in 6 months for reevaluation.       Equivalent angina (HCC)    Chronic. Has not followed up with Cardiology Since September 2022.  Encouraged patient to follow up with them for ongoing management. Return in 6 months for reevaluation.       Longstanding  persistent atrial fibrillation (HCC)    Chronic. Has not followed up with Cardiology Since September 2022.  Continue with Xerelto and Metoprolol.  Encouraged patient to follow up with them for ongoing management. Return in 6 months for reevaluation.       Other Visit Diagnoses     Annual physical exam    -  Primary   Health maintenance reviewed during visit today. Labs ordered. Declined vaccines.   Relevant Orders   TSH   PSA   Lipid panel   CBC with Differential/Platelet   Comprehensive metabolic panel   Urinalysis, Routine w reflex microscopic   Screening for ischemic heart disease       Relevant Orders   Lipid panel        Discussed aspirin prophylaxis for myocardial infarction prevention and decision was  on Xerelto  LABORATORY TESTING:  Health maintenance labs ordered today as discussed above.   The natural history of prostate cancer and ongoing controversy regarding screening and potential treatment outcomes of prostate cancer has been discussed with the patient. The meaning of  a false positive PSA and a false negative PSA has been discussed. He indicates understanding of the limitations of this screening test and wishes to proceed with screening PSA testing.   IMMUNIZATIONS:   - Tdap: Tetanus vaccination status reviewed: last tetanus booster within 10 years. - Influenza: Refused - Pneumovax: Refused - Prevnar: Not applicable - COVID: Refused - HPV: Not applicable - Shingrix vaccine: Refused  SCREENING: - Colonoscopy:  Not up to date   Discussed with patient purpose of the colonoscopy is to detect colon cancer at curable precancerous or early stages   - AAA Screening: Not applicable  -Hearing Test: Not applicable  -Spirometry: Not applicable   PATIENT COUNSELING:    Sexuality: Discussed sexually transmitted diseases, partner selection, use of condoms, avoidance of unintended pregnancy  and contraceptive alternatives.   Advised to avoid cigarette smoking.  I  discussed with the patient that most people either abstain from alcohol or drink within safe limits (<=14/week and <=4 drinks/occasion for males, <=7/weeks and <= 3 drinks/occasion for females) and that the risk for alcohol disorders and other health effects rises proportionally with the number of drinks per week and how often a drinker exceeds daily limits.  Discussed cessation/primary prevention of drug use and availability of treatment for abuse.   Diet: Encouraged to adjust caloric intake to maintain  or achieve ideal body weight, to reduce intake of dietary saturated fat and total fat, to limit sodium intake by avoiding high sodium foods and not adding table salt, and to maintain adequate dietary potassium and calcium preferably from fresh fruits, vegetables, and low-fat dairy products.    stressed the importance of regular exercise  Injury prevention: Discussed safety belts, safety helmets, smoke detector, smoking near bedding or upholstery.   Dental health: Discussed importance of regular tooth brushing, flossing, and dental visits.   Follow up plan: NEXT PREVENTATIVE PHYSICAL DUE IN 1 YEAR. Return in about 6 months (around 03/05/2022) for HTN, HLD, DM2 FU.

## 2021-09-02 NOTE — Assessment & Plan Note (Signed)
Chronic. Has not followed up with Cardiology Since September 2022.  Encouraged patient to follow up with them for ongoing management. Return in 6 months for reevaluation.  ?

## 2021-09-02 NOTE — Assessment & Plan Note (Signed)
Chronic. Has not followed up with Cardiology Since September 2022.  Continue with Xerelto and Metoprolol.  Encouraged patient to follow up with them for ongoing management. Return in 6 months for reevaluation.  ?

## 2021-09-02 NOTE — Assessment & Plan Note (Signed)
Chronic. Has not followed up with Cardiology Since September 2022.  Encouraged patient to follow up with them for ongoing management. Return in 6 months for reevaluation.  ?

## 2021-09-03 DIAGNOSIS — R8290 Unspecified abnormal findings in urine: Secondary | ICD-10-CM | POA: Diagnosis not present

## 2021-09-03 LAB — CBC WITH DIFFERENTIAL/PLATELET
Basophils Absolute: 0 10*3/uL (ref 0.0–0.2)
Basos: 0 %
EOS (ABSOLUTE): 0.1 10*3/uL (ref 0.0–0.4)
Eos: 2 %
Hematocrit: 46.6 % (ref 37.5–51.0)
Hemoglobin: 15.8 g/dL (ref 13.0–17.7)
Immature Grans (Abs): 0 10*3/uL (ref 0.0–0.1)
Immature Granulocytes: 0 %
Lymphocytes Absolute: 2.3 10*3/uL (ref 0.7–3.1)
Lymphs: 40 %
MCH: 30.4 pg (ref 26.6–33.0)
MCHC: 33.9 g/dL (ref 31.5–35.7)
MCV: 90 fL (ref 79–97)
Monocytes Absolute: 0.7 10*3/uL (ref 0.1–0.9)
Monocytes: 11 %
Neutrophils Absolute: 2.7 10*3/uL (ref 1.4–7.0)
Neutrophils: 47 %
Platelets: 277 10*3/uL (ref 150–450)
RBC: 5.2 x10E6/uL (ref 4.14–5.80)
RDW: 13.1 % (ref 11.6–15.4)
WBC: 5.8 10*3/uL (ref 3.4–10.8)

## 2021-09-03 LAB — COMPREHENSIVE METABOLIC PANEL
ALT: 14 IU/L (ref 0–44)
AST: 17 IU/L (ref 0–40)
Albumin/Globulin Ratio: 1.5 (ref 1.2–2.2)
Albumin: 4.3 g/dL (ref 3.8–4.8)
Alkaline Phosphatase: 121 IU/L (ref 44–121)
BUN/Creatinine Ratio: 9 — ABNORMAL LOW (ref 10–24)
BUN: 13 mg/dL (ref 8–27)
Bilirubin Total: 0.4 mg/dL (ref 0.0–1.2)
CO2: 22 mmol/L (ref 20–29)
Calcium: 10.1 mg/dL (ref 8.6–10.2)
Chloride: 102 mmol/L (ref 96–106)
Creatinine, Ser: 1.49 mg/dL — ABNORMAL HIGH (ref 0.76–1.27)
Globulin, Total: 2.8 g/dL (ref 1.5–4.5)
Glucose: 78 mg/dL (ref 70–99)
Potassium: 4.5 mmol/L (ref 3.5–5.2)
Sodium: 138 mmol/L (ref 134–144)
Total Protein: 7.1 g/dL (ref 6.0–8.5)
eGFR: 53 mL/min/{1.73_m2} — ABNORMAL LOW (ref >59)

## 2021-09-03 LAB — LIPID PANEL
Chol/HDL Ratio: 4.5 ratio (ref 0.0–5.0)
Cholesterol, Total: 134 mg/dL (ref 100–199)
HDL: 30 mg/dL — ABNORMAL LOW (ref >39)
LDL Chol Calc (NIH): 86 mg/dL (ref 0–99)
Triglycerides: 93 mg/dL (ref 0–149)
VLDL Cholesterol Cal: 18 mg/dL (ref 5–40)

## 2021-09-03 LAB — TSH: TSH: 2.95 u[IU]/mL (ref 0.450–4.500)

## 2021-09-03 LAB — PSA: Prostate Specific Ag, Serum: 0.3 ng/mL (ref 0.0–4.0)

## 2021-09-03 NOTE — Addendum Note (Signed)
Addended by: Jon Billings on: 09/03/2021 11:54 AM ? ? Modules accepted: Orders ? ?

## 2021-09-03 NOTE — Progress Notes (Signed)
Hi Mr. Dwayne Ramirez.  It was nice to meet you yesterday.  Your lab work shows that your cholesterol is well controlled.  Your kidneys are stable at chronic kidney disease stage 3. Your urine was abnormal so I have sent it for culture and will treat you if necessary.  Please let me know if you have any questions. Continue with your current medication regimen.  Make sure to follow up with Cardiology.

## 2021-09-05 LAB — URINE CULTURE: Organism ID, Bacteria: NO GROWTH

## 2021-09-05 NOTE — Progress Notes (Signed)
Hi Dwayne Ramirez.  Your urine did not grow any bacteria.  There is no need to have further treatment.  Make sure you are well hydrated.

## 2021-09-18 DIAGNOSIS — I4819 Other persistent atrial fibrillation: Secondary | ICD-10-CM | POA: Diagnosis not present

## 2021-09-18 DIAGNOSIS — I420 Dilated cardiomyopathy: Secondary | ICD-10-CM | POA: Diagnosis not present

## 2021-09-18 DIAGNOSIS — I071 Rheumatic tricuspid insufficiency: Secondary | ICD-10-CM | POA: Diagnosis not present

## 2021-09-18 DIAGNOSIS — I340 Nonrheumatic mitral (valve) insufficiency: Secondary | ICD-10-CM | POA: Diagnosis not present

## 2021-09-21 DIAGNOSIS — Z419 Encounter for procedure for purposes other than remedying health state, unspecified: Secondary | ICD-10-CM | POA: Diagnosis not present

## 2021-10-21 DIAGNOSIS — Z419 Encounter for procedure for purposes other than remedying health state, unspecified: Secondary | ICD-10-CM | POA: Diagnosis not present

## 2021-11-20 DIAGNOSIS — Z125 Encounter for screening for malignant neoplasm of prostate: Secondary | ICD-10-CM | POA: Diagnosis not present

## 2021-11-20 DIAGNOSIS — I10 Essential (primary) hypertension: Secondary | ICD-10-CM | POA: Diagnosis not present

## 2021-11-20 DIAGNOSIS — I420 Dilated cardiomyopathy: Secondary | ICD-10-CM | POA: Diagnosis not present

## 2021-11-20 DIAGNOSIS — Z1211 Encounter for screening for malignant neoplasm of colon: Secondary | ICD-10-CM | POA: Diagnosis not present

## 2021-11-20 DIAGNOSIS — I071 Rheumatic tricuspid insufficiency: Secondary | ICD-10-CM | POA: Diagnosis not present

## 2021-11-20 DIAGNOSIS — R739 Hyperglycemia, unspecified: Secondary | ICD-10-CM | POA: Diagnosis not present

## 2021-11-20 DIAGNOSIS — I4819 Other persistent atrial fibrillation: Secondary | ICD-10-CM | POA: Diagnosis not present

## 2021-11-21 DIAGNOSIS — Z419 Encounter for procedure for purposes other than remedying health state, unspecified: Secondary | ICD-10-CM | POA: Diagnosis not present

## 2021-12-01 DIAGNOSIS — Z1211 Encounter for screening for malignant neoplasm of colon: Secondary | ICD-10-CM | POA: Diagnosis not present

## 2021-12-01 LAB — COLOGUARD: Cologuard: NEGATIVE

## 2021-12-11 LAB — COLOGUARD: COLOGUARD: NEGATIVE

## 2021-12-21 DIAGNOSIS — Z419 Encounter for procedure for purposes other than remedying health state, unspecified: Secondary | ICD-10-CM | POA: Diagnosis not present

## 2022-01-01 DIAGNOSIS — H5213 Myopia, bilateral: Secondary | ICD-10-CM | POA: Diagnosis not present

## 2022-01-21 DIAGNOSIS — Z419 Encounter for procedure for purposes other than remedying health state, unspecified: Secondary | ICD-10-CM | POA: Diagnosis not present

## 2022-02-21 DIAGNOSIS — Z419 Encounter for procedure for purposes other than remedying health state, unspecified: Secondary | ICD-10-CM | POA: Diagnosis not present

## 2022-03-05 ENCOUNTER — Ambulatory Visit: Payer: Medicaid Other | Admitting: Nurse Practitioner

## 2022-03-23 DIAGNOSIS — Z419 Encounter for procedure for purposes other than remedying health state, unspecified: Secondary | ICD-10-CM | POA: Diagnosis not present

## 2022-04-07 ENCOUNTER — Ambulatory Visit: Payer: Medicaid Other | Admitting: Nurse Practitioner

## 2022-04-23 DIAGNOSIS — Z419 Encounter for procedure for purposes other than remedying health state, unspecified: Secondary | ICD-10-CM | POA: Diagnosis not present

## 2022-04-28 DIAGNOSIS — R7303 Prediabetes: Secondary | ICD-10-CM | POA: Insufficient documentation

## 2022-04-28 DIAGNOSIS — N1831 Chronic kidney disease, stage 3a: Secondary | ICD-10-CM | POA: Insufficient documentation

## 2022-04-28 NOTE — Progress Notes (Deleted)
There were no vitals taken for this visit.   Subjective:    Patient ID: Dwayne Ramirez, male    DOB: 07-16-1960, 61 y.o.   MRN: [id number]  HPI: Dwayne Ramirez is a 61 y.o. male  No chief complaint on file.  CHF/HYPERTENSION {Blank single:19197::"without","with"} Chronic Kidney Disease Hypertension status: {Blank single:19197::"controlled","uncontrolled","better","worse","exacerbated","stable"}  Satisfied with current treatment? {Blank single:19197::"yes","no"} Duration of hypertension: {Blank single:19197::"chronic","months","years"} BP monitoring frequency:  {Blank single:19197::"not checking","rarely","daily","weekly","monthly","a few times a day","a few times a week","a few times a month"} BP range:  BP medication side effects:  {Blank single:19197::"yes","no"} Medication compliance: {Blank single:19197::"excellent compliance","good compliance","fair compliance","poor compliance"} Previous BP meds:{Blank IAXKPVVZ:48270::"BEML","JQGBEEFEOF","HQRFXJOITG/PQDIYMEBRA","XENMMHWK","GSUPJSRPRX","YVOPFYTWKM/QKMM","NOTRRNHAFB (bystolic)","carvedilol","chlorthalidone","clonidine","diltiazem","exforge HCT","HCTZ","irbesartan (avapro)","labetalol","lisinopril","lisinopril-HCTZ","losartan (cozaar)","methyldopa","nifedipine","olmesartan (benicar)","olmesartan-HCTZ","quinapril","ramipril","spironalactone","tekturna","valsartan","valsartan-HCTZ","verapamil"} Aspirin: {Blank single:19197::"yes","no"} Recurrent headaches: {Blank single:19197::"yes","no"} Visual changes: {Blank single:19197::"yes","no"} Palpitations: {Blank single:19197::"yes","no"} Dyspnea: {Blank single:19197::"yes","no"} Chest pain: {Blank single:19197::"yes","no"} Lower extremity edema: {Blank single:19197::"yes","no"} Dizzy/lightheaded: {Blank single:19197::"yes","no"}  CHRONIC KIDNEY DISEASE CKD status: {Blank single:19197::"controlled","uncontrolled","better","worse","exacerbated","stable"} Medications renally dose: {Blank  single:19197::"yes","no"} Previous renal evaluation: {Blank single:19197::"yes","no"} Pneumovax:  {Blank single:19197::"Up to Date","Not up to Date","unknown"} Influenza Vaccine:  {Blank single:19197::"Up to Date","Not up to Date","unknown"}   Relevant past medical, surgical, family and social history reviewed and updated as indicated. Interim medical history since our last visit reviewed. Allergies and medications reviewed and updated.  Review of Systems  Per HPI unless specifically indicated above     Objective:    There were no vitals taken for this visit.  Wt Readings from Last 3 Encounters:  09/02/21 267 lb 6.4 oz (121.3 kg)  07/20/21 268 lb (121.6 kg)  10/17/19 268 lb (121.6 kg)    Physical Exam  Results for orders placed or performed in visit on 09/02/21  Microscopic Examination   Urine  Result Value Ref Range   WBC, UA 0-5 0 - 5 /hpf   RBC, Urine None seen 0 - 2 /hpf   Epithelial Cells (non renal) None seen 0 - 10 /hpf   Mucus, UA Present (A) Not Estab.   Bacteria, UA None seen None seen/Few  Urine Culture   Specimen: Urine   UR  Result Value Ref Range   Urine Culture, Routine Final report    Organism ID, Bacteria No growth   TSH  Result Value Ref Range   TSH 2.950 0.450 - 4.500 uIU/mL  PSA  Result Value Ref Range   Prostate Specific Ag, Serum 0.3 0.0 - 4.0 ng/mL  Lipid panel  Result Value Ref Range   Cholesterol, Total 134 100 - 199 mg/dL   Triglycerides 93 0 - 149 mg/dL   HDL 30 (L) >39 mg/dL   VLDL Cholesterol Cal 18 5 - 40 mg/dL   LDL Chol Calc (NIH) 86 0 - 99 mg/dL   Chol/HDL Ratio 4.5 0.0 - 5.0 ratio  CBC with Differential/Platelet  Result Value Ref Range   WBC 5.8 3.4 - 10.8 x10E3/uL   RBC 5.20 4.14 - 5.80 x10E6/uL   Hemoglobin 15.8 13.0 - 17.7 g/dL   Hematocrit 46.6 37.5 - 51.0 %   MCV 90 79 - 97 fL   MCH 30.4 26.6 - 33.0 pg   MCHC 33.9 31.5 - 35.7 g/dL   RDW 13.1 11.6 - 15.4 %   Platelets 277 150 - 450 x10E3/uL   Neutrophils 47 Not  Estab. %   Lymphs 40 Not Estab. %   Monocytes 11 Not Estab. %   Eos 2 Not Estab. %   Basos 0 Not Estab. %   Neutrophils Absolute 2.7 1.4 - 7.0 x10E3/uL   Lymphocytes Absolute 2.3 0.7 - 3.1 x10E3/uL  Monocytes Absolute 0.7 0.1 - 0.9 x10E3/uL   EOS (ABSOLUTE) 0.1 0.0 - 0.4 x10E3/uL   Basophils Absolute 0.0 0.0 - 0.2 x10E3/uL   Immature Granulocytes 0 Not Estab. %   Immature Grans (Abs) 0.0 0.0 - 0.1 x10E3/uL  Comprehensive metabolic panel  Result Value Ref Range   Glucose 78 70 - 99 mg/dL   BUN 13 8 - 27 mg/dL   Creatinine, Ser 1.49 (H) 0.76 - 1.27 mg/dL   eGFR 53 (L) >59 mL/min/1.73   BUN/Creatinine Ratio 9 (L) 10 - 24   Sodium 138 134 - 144 mmol/L   Potassium 4.5 3.5 - 5.2 mmol/L   Chloride 102 96 - 106 mmol/L   CO2 22 20 - 29 mmol/L   Calcium 10.1 8.6 - 10.2 mg/dL   Total Protein 7.1 6.0 - 8.5 g/dL   Albumin 4.3 3.8 - 4.8 g/dL   Globulin, Total 2.8 1.5 - 4.5 g/dL   Albumin/Globulin Ratio 1.5 1.2 - 2.2   Bilirubin Total 0.4 0.0 - 1.2 mg/dL   Alkaline Phosphatase 121 44 - 121 IU/L   AST 17 0 - 40 IU/L   ALT 14 0 - 44 IU/L  Urinalysis, Routine w reflex microscopic  Result Value Ref Range   Specific Gravity, UA >1.030 (H) 1.005 - 1.030   pH, UA 5.5 5.0 - 7.5   Color, UA Yellow Yellow   Appearance Ur Clear Clear   Leukocytes,UA Trace (A) Negative   Protein,UA 1+ (A) Negative/Trace   Glucose, UA Negative Negative   Ketones, UA Trace (A) Negative   RBC, UA Negative Negative   Bilirubin, UA Negative Negative   Urobilinogen, Ur 0.2 0.2 - 1.0 mg/dL   Nitrite, UA Negative Negative   Microscopic Examination See below:       Assessment & Plan:   Problem List Items Addressed This Visit   None    Follow up plan: No follow-ups on file.

## 2022-04-29 ENCOUNTER — Ambulatory Visit: Payer: Medicaid Other | Admitting: Nurse Practitioner

## 2022-04-29 DIAGNOSIS — B182 Chronic viral hepatitis C: Secondary | ICD-10-CM

## 2022-04-29 DIAGNOSIS — I4811 Longstanding persistent atrial fibrillation: Secondary | ICD-10-CM

## 2022-04-29 DIAGNOSIS — R7303 Prediabetes: Secondary | ICD-10-CM

## 2022-04-29 DIAGNOSIS — I10 Essential (primary) hypertension: Secondary | ICD-10-CM

## 2022-04-29 DIAGNOSIS — N1831 Chronic kidney disease, stage 3a: Secondary | ICD-10-CM

## 2022-04-29 DIAGNOSIS — I5022 Chronic systolic (congestive) heart failure: Secondary | ICD-10-CM

## 2022-04-30 NOTE — Progress Notes (Unsigned)
   There were no vitals taken for this visit.   Subjective:    Patient ID: Dwayne Ramirez, male    DOB: [birth year], 61 y.o.   MRN: [id number]  HPI: Dwayne Ramirez is a 61 y.o. male  No chief complaint on file.  CHF/HYPERTENSION {Blank single:19197::"without","with"} Chronic Kidney Disease Hypertension status: {Blank single:19197::"controlled","uncontrolled","better","worse","exacerbated","stable"}  Satisfied with current treatment? {Blank single:19197::"yes","no"} Duration of hypertension: {Blank single:19197::"chronic","months","years"} BP monitoring frequency:  {Blank single:19197::"not checking","rarely","daily","weekly","monthly","a few times a day","a few times a week","a few times a month"} BP range:  BP medication side effects:  {Blank single:19197::"yes","no"} Medication compliance: {Blank single:19197::"excellent compliance","good compliance","fair compliance","poor compliance"} Previous BP meds:{Blank multiple:19196::"none","amlodipine","amlodipine/benazepril","atenolol","benazepril","benazepril/HCTZ","bisoprolol (bystolic)","carvedilol","chlorthalidone","clonidine","diltiazem","exforge HCT","HCTZ","irbesartan (avapro)","labetalol","lisinopril","lisinopril-HCTZ","losartan (cozaar)","methyldopa","nifedipine","olmesartan (benicar)","olmesartan-HCTZ","quinapril","ramipril","spironalactone","tekturna","valsartan","valsartan-HCTZ","verapamil"} Aspirin: {Blank single:19197::"yes","no"} Recurrent headaches: {Blank single:19197::"yes","no"} Visual changes: {Blank single:19197::"yes","no"} Palpitations: {Blank single:19197::"yes","no"} Dyspnea: {Blank single:19197::"yes","no"} Chest pain: {Blank single:19197::"yes","no"} Lower extremity edema: {Blank single:19197::"yes","no"} Dizzy/lightheaded: {Blank single:19197::"yes","no"}  CHRONIC KIDNEY DISEASE CKD status: {Blank single:19197::"controlled","uncontrolled","better","worse","exacerbated","stable"} Medications renally dose: {Blank  single:19197::"yes","no"} Previous renal evaluation: {Blank single:19197::"yes","no"} Pneumovax:  {Blank single:19197::"Up to Date","Not up to Date","unknown"} Influenza Vaccine:  {Blank single:19197::"Up to Date","Not up to Date","unknown"}   Relevant past medical, surgical, family and social history reviewed and updated as indicated. Interim medical history since our last visit reviewed. Allergies and medications reviewed and updated.  Review of Systems  Per HPI unless specifically indicated above     Objective:    There were no vitals taken for this visit.  Wt Readings from Last 3 Encounters:  09/02/21 267 lb 6.4 oz (121.3 kg)  07/20/21 268 lb (121.6 kg)  10/17/19 268 lb (121.6 kg)    Physical Exam  Results for orders placed or performed in visit on 04/28/22  Cologuard  Result Value Ref Range   Cologuard Negative Negative      Assessment & Plan:   Problem List Items Addressed This Visit      Cardiovascular and Mediastinum   Paroxysmal atrial fibrillation (HCC)   Essential (primary) hypertension   Dilated cardiomyopathy (HCC)   Chronic HFrEF (heart failure with reduced ejection fraction) (HCC) - Primary     Digestive   Hepatitis C, chronic (HCC)     Genitourinary   Stage 3a chronic kidney disease (HCC)     Other   Prediabetes      Follow up plan: No follow-ups on file.

## 2022-05-01 ENCOUNTER — Encounter: Payer: Self-pay | Admitting: Nurse Practitioner

## 2022-05-01 ENCOUNTER — Ambulatory Visit (INDEPENDENT_AMBULATORY_CARE_PROVIDER_SITE_OTHER): Payer: Medicaid Other | Admitting: Nurse Practitioner

## 2022-05-01 VITALS — BP 135/81 | HR 60 | Temp 97.9°F | Wt 264.6 lb

## 2022-05-01 DIAGNOSIS — R7303 Prediabetes: Secondary | ICD-10-CM | POA: Diagnosis not present

## 2022-05-01 DIAGNOSIS — B182 Chronic viral hepatitis C: Secondary | ICD-10-CM

## 2022-05-01 DIAGNOSIS — I10 Essential (primary) hypertension: Secondary | ICD-10-CM | POA: Diagnosis not present

## 2022-05-01 DIAGNOSIS — N1831 Chronic kidney disease, stage 3a: Secondary | ICD-10-CM

## 2022-05-01 DIAGNOSIS — I5022 Chronic systolic (congestive) heart failure: Secondary | ICD-10-CM

## 2022-05-01 DIAGNOSIS — I420 Dilated cardiomyopathy: Secondary | ICD-10-CM

## 2022-05-01 DIAGNOSIS — I480 Paroxysmal atrial fibrillation: Secondary | ICD-10-CM | POA: Diagnosis not present

## 2022-05-01 NOTE — Assessment & Plan Note (Signed)
Labs ordered at visit today.  Will make recommendations based on lab results.   

## 2022-05-01 NOTE — Assessment & Plan Note (Signed)
Chronic. Followed up with Cardiology in May.  Encouraged patient to follow up with them for ongoing management. Return in 6 months for reevaluation. Call sooner if concerns arise.

## 2022-05-01 NOTE — Assessment & Plan Note (Signed)
Patient unsure if he received treatment. New referral placed for evaluation with GI.

## 2022-05-01 NOTE — Assessment & Plan Note (Signed)
Chronic.  Rate much higher than his typical baseline, unclear what the average has been off diltizem. On Xerelto for anticoagulation. Continue to follow up with Cardiology.  Labs ordered today.

## 2022-05-01 NOTE — Assessment & Plan Note (Signed)
Chronic.  Controlled.  Continue with current medication regimen.  Labs ordered today.  Return to clinic in 6 months for reevaluation.  Call sooner if concerns arise.  ? ?

## 2022-05-01 NOTE — Assessment & Plan Note (Signed)
Chronic.  -Rate controlled. Following with cardiology -On Cardizem and metoprolol. -On Xarelto for anticoagulation.

## 2022-05-02 LAB — COMPREHENSIVE METABOLIC PANEL
ALT: 14 IU/L (ref 0–44)
AST: 16 IU/L (ref 0–40)
Albumin/Globulin Ratio: 1.8 (ref 1.2–2.2)
Albumin: 4.4 g/dL (ref 3.9–4.9)
Alkaline Phosphatase: 107 IU/L (ref 44–121)
BUN/Creatinine Ratio: 7 — ABNORMAL LOW (ref 10–24)
BUN: 9 mg/dL (ref 8–27)
Bilirubin Total: 0.3 mg/dL (ref 0.0–1.2)
CO2: 21 mmol/L (ref 20–29)
Calcium: 9.5 mg/dL (ref 8.6–10.2)
Chloride: 105 mmol/L (ref 96–106)
Creatinine, Ser: 1.26 mg/dL (ref 0.76–1.27)
Globulin, Total: 2.5 g/dL (ref 1.5–4.5)
Glucose: 112 mg/dL — ABNORMAL HIGH (ref 70–99)
Potassium: 4.2 mmol/L (ref 3.5–5.2)
Sodium: 140 mmol/L (ref 134–144)
Total Protein: 6.9 g/dL (ref 6.0–8.5)
eGFR: 64 mL/min/{1.73_m2} (ref >59)

## 2022-05-02 LAB — HEMOGLOBIN A1C
Est. average glucose Bld gHb Est-mCnc: 128 mg/dL
Hgb A1c MFr Bld: 6.1 % — ABNORMAL HIGH (ref 4.8–5.6)

## 2022-05-05 NOTE — Progress Notes (Signed)
Please let patient know that his lab work shows that his A1c is well controlled at 6.1.  Kidney function remains stable.  No concerns at this time.  Continue with current medication regimen.  Follow up as discussed.

## 2022-05-23 DIAGNOSIS — Z419 Encounter for procedure for purposes other than remedying health state, unspecified: Secondary | ICD-10-CM | POA: Diagnosis not present

## 2022-06-05 ENCOUNTER — Ambulatory Visit: Payer: Self-pay

## 2022-06-05 DIAGNOSIS — I4811 Longstanding persistent atrial fibrillation: Secondary | ICD-10-CM | POA: Diagnosis not present

## 2022-06-05 DIAGNOSIS — I340 Nonrheumatic mitral (valve) insufficiency: Secondary | ICD-10-CM | POA: Diagnosis not present

## 2022-06-05 DIAGNOSIS — I071 Rheumatic tricuspid insufficiency: Secondary | ICD-10-CM | POA: Diagnosis not present

## 2022-06-05 DIAGNOSIS — I420 Dilated cardiomyopathy: Secondary | ICD-10-CM | POA: Diagnosis not present

## 2022-06-05 DIAGNOSIS — I10 Essential (primary) hypertension: Secondary | ICD-10-CM | POA: Diagnosis not present

## 2022-06-05 NOTE — Telephone Encounter (Signed)
Reason for Disposition  [1] Numbness or tingling in one or both hands AND [2] is a chronic symptom (recurrent or ongoing AND present > 4 weeks)  Answer Assessment - Initial Assessment Questions 1. SYMPTOM: "What is the main symptom you are concerned about?" (e.g., weakness, numbness)     Tingling in both feet, toes are discolored - toe nail  are discolored. 2. ONSET: "When did this start?" (minutes, hours, days; while sleeping)     1 year for tingling - 6-7 months ago toes changing color 3. LAST NORMAL: "When was the last time you (the patient) were normal (no symptoms)?"     1 month 4. PATTERN "Does this come and go, or has it been constant since it started?"  "Is it present now?"     All the time 5. CARDIAC SYMPTOMS: "Have you had any of the following symptoms: chest pain, difficulty breathing, palpitations?"     no 6. NEUROLOGIC SYMPTOMS: "Have you had any of the following symptoms: headache, dizziness, vision loss, double vision, changes in speech, unsteady on your feet?"     Tingling in feet. 7. OTHER SYMPTOMS: "Do you have any other symptoms?"     Can't hold urine, Mouth always dry 8. PREGNANCY: "Is there any chance you are pregnant?" "When was your last menstrual period?"  Protocols used: Neurologic Deficit-A-AH

## 2022-06-05 NOTE — Telephone Encounter (Signed)
  Chief Complaint: Loss of feeling in feet - toenail are discoloured. Frequent urination, dry mouth Symptoms: above Frequency: 1 year Pertinent Negatives: Patient denies HA, Chest pain, Sob Disposition: [] ED /[] Urgent Care (no appt availability in office) / [x] Appointment(In office/virtual)/ []  [facility]/ [] Home Care/ [] Refused Recommended Disposition /[] [facility]/ []  Follow-up with PCP Additional Notes: Pt states that he has had tingling/numbness in his feet for 1 year. He states that toenails started changing colour 6-7 months ago. PT reports frequent urination and dry mouth.

## 2022-06-06 ENCOUNTER — Encounter: Payer: Self-pay | Admitting: Physician Assistant

## 2022-06-06 ENCOUNTER — Ambulatory Visit (INDEPENDENT_AMBULATORY_CARE_PROVIDER_SITE_OTHER): Payer: Medicaid Other | Admitting: Physician Assistant

## 2022-06-06 VITALS — BP 136/92 | HR 53 | Temp 98.2°F | Ht 75.0 in | Wt 259.9 lb

## 2022-06-06 DIAGNOSIS — R200 Anesthesia of skin: Secondary | ICD-10-CM | POA: Diagnosis not present

## 2022-06-06 DIAGNOSIS — R202 Paresthesia of skin: Secondary | ICD-10-CM

## 2022-06-06 NOTE — Progress Notes (Signed)
Established Patient Office Visit  Name: Dwayne Ramirez   MRN: [id number]    DOB: 08-Aug-1960   Date:06/06/2022  Today's Provider: Talitha Givens, MHS, PA-C Introduced myself to the patient as a PA-C and provided education on APPs in clinical practice.         Subjective  Chief Complaint  Chief Complaint  Patient presents with   Numbness     Reports numbness in feet and discoloration     HPI  Concern for numbness in both feet and discoloration  Reports his feet have been numb for almost a year- states he told a previous provider this but "they chose to ignore it"   Reports numbness is constant and is typically along tips of toes  Denies toes being cool or warm to the touch States his toenails have been changing colors  Denies back pain or leg pain Denies injuries or trauma to feet or toes    Discussed need for labs today to check potential etiologies- patient replied "that's all you want to do here is labs, am I going to get something for my feet" Discussed his most recent A1c results and that it is unlikely he has diabetic neuropathy as A1c was 6.1 in Nov  Reviewed need to do labs so we can have a better understanding of what may be causing his symptoms and if we can correct it with supplementation like a B12 deficiency    Patient Active Problem List   Diagnosis Date Noted   Numbness and tingling of both feet 06/06/2022   Stage 3a chronic kidney disease ([place]) 04/28/2022   Prediabetes 04/28/2022   Equivalent angina 09/02/2021   Longstanding persistent atrial fibrillation ([place]) 09/02/2021   Severe tricuspid valve regurgitation 08/18/2020   Moderate mitral valve regurgitation 08/18/2020   Chronic HFrEF (heart failure with reduced ejection fraction) ([place]) 08/18/2020   Hepatitis C, chronic ([place]) 02/21/2019   Paroxysmal atrial fibrillation ([place])    Essential (primary) hypertension    Dilated cardiomyopathy ([place])     History reviewed. No pertinent surgical  history.  Family History  Problem Relation Age of Onset   Hypertension Mother    Hypertension Father    Cancer Neg Hx    Diabetes Neg Hx    COPD Neg Hx    Heart disease Neg Hx    Stroke Neg Hx     Social History   Tobacco Use   Smoking status: Never   Smokeless tobacco: Never  Substance Use Topics   Alcohol use: No     Current Outpatient Medications:    BIDIL 20-37.5 MG tablet, TAKE 1 TABLET BY MOUTH THREE TIMES DAILY, Disp: 270 tablet, Rfl: 0   cloNIDine (CATAPRES) 0.2 MG tablet, Take 1 tablet (0.2 mg total) by mouth 2 (two) times daily., Disp: 60 tablet, Rfl: 6   diltiazem (CARDIZEM SR) 90 MG 12 hr capsule, TAKE 1 CAPSULE(90 MG) BY MOUTH TWICE DAILY, Disp: 60 capsule, Rfl: 0   furosemide (LASIX) 40 MG tablet, Take 1 tablet (40 mg total) by mouth daily., Disp: 90 tablet, Rfl: 1   losartan (COZAAR) 100 MG tablet, Take 1 tablet (100 mg total) by mouth daily., Disp: 90 tablet, Rfl: 1   metoprolol tartrate (LOPRESSOR) 25 MG tablet, Take 1 tablet (25 mg total) by mouth 2 (two) times daily., Disp: 180 tablet, Rfl: 1   Rivaroxaban (XARELTO) 15 MG TABS tablet, Take 1 tablet (15 mg total) by mouth daily., Disp: 90 tablet, Rfl:  1   spironolactone (ALDACTONE) 25 MG tablet, TAKE 1/2 TABLET BY MOUTH TWICE DAILY, Disp: 90 tablet, Rfl: 1  No Known Allergies  I personally reviewed active problem list, medication list, allergies, lab results with the patient/caregiver today.   Review of Systems  Musculoskeletal:  Negative for back pain, falls and myalgias.  Neurological:  Positive for tingling. Negative for tremors, loss of consciousness and weakness.      Objective  Vitals:   06/06/22 1537  BP: (!) 136/92  Pulse: (!) 53  Temp: 98.2 F (36.8 C)  TempSrc: Oral  SpO2: 95%  Weight: 259 lb 14.4 oz (117.9 kg)  Height: _0  (1.905 m)    Body mass index is 32.49 kg/m.  Physical Exam Vitals reviewed.  Constitutional:      General: He is awake.     Appearance: Normal  appearance. He is well-developed and well-groomed.  HENT:     Head: Normocephalic and atraumatic.  Cardiovascular:     Pulses:          Dorsalis pedis pulses are 2+ on the right side and 2+ on the left side.       Posterior tibial pulses are 2+ on the right side and 2+ on the left side.  Pulmonary:     Effort: Pulmonary effort is normal.  Musculoskeletal:     Right foot: Normal range of motion. No deformity.     Left foot: Normal range of motion. No deformity.  Feet:     Right foot:     Protective Sensation: 8 sites tested.  0 sites sensed.     Skin integrity: No ulcer, blister, skin breakdown, erythema, warmth, callus, dry skin or fissure.     Toenail Condition: Right toenails are abnormally thick. Fungal disease present.    Left foot:     Protective Sensation: 8 sites tested.  0 sites sensed.     Skin integrity: No ulcer, blister, skin breakdown, erythema, warmth, callus, dry skin or fissure.     Toenail Condition: Left toenails are abnormally thick. Fungal disease present.    Comments: Strength is 5/5 with dorsiflexion and plantar flexion  ROM is intact bilaterally  Neurological:     General: No focal deficit present.     Mental Status: He is alert and oriented to person, place, and time.     GCS: GCS eye subscore is 4. GCS verbal subscore is 5. GCS motor subscore is 6.     Cranial Nerves: No dysarthria or facial asymmetry.     Motor: No weakness, tremor or atrophy.     Gait: Gait is intact.     Comments: Proprioception intact bilaterally with position sense testing Patient was nonresponsive to monofilament testing along plantar and dorsal aspects of both feet    Psychiatric:        Behavior: Behavior is cooperative.      Recent Results (from the past 2160 hour(s))  Comp Met (CMET)     Status: Abnormal   Collection Time: 05/01/22  8:54 AM  Result Value Ref Range   Glucose 112 (H) 70 - 99 mg/dL   BUN 9 8 - 27 mg/dL   Creatinine, Ser 1.26 0.76 - 1.27 mg/dL   eGFR 64 >59  mL/min/1.73   BUN/Creatinine Ratio 7 (L) 10 - 24   Sodium 140 134 - 144 mmol/L   Potassium 4.2 3.5 - 5.2 mmol/L   Chloride 105 96 - 106 mmol/L   CO2 21 20 - 29 mmol/L  Calcium 9.5 8.6 - 10.2 mg/dL   Total Protein 6.9 6.0 - 8.5 g/dL   Albumin 4.4 3.9 - 4.9 g/dL   Globulin, Total 2.5 1.5 - 4.5 g/dL   Albumin/Globulin Ratio 1.8 1.2 - 2.2   Bilirubin Total 0.3 0.0 - 1.2 mg/dL   Alkaline Phosphatase 107 44 - 121 IU/L   AST 16 0 - 40 IU/L   ALT 14 0 - 44 IU/L  HgB A1c     Status: Abnormal   Collection Time: 05/01/22  8:54 AM  Result Value Ref Range   Hgb A1c MFr Bld 6.1 (H) 4.8 - 5.6 %    Comment:          Prediabetes: 5.7 - 6.4          Diabetes: >6.4          Glycemic control for adults with diabetes: <7.0    Est. average glucose Bld gHb Est-mCnc 128 mg/dL     PHQ2/9:    06/06/2022    3:42 PM 05/01/2022    8:31 AM 09/02/2021    2:28 PM 08/19/2019   11:16 AM 02/09/2018    3:17 PM  Depression screen PHQ 2/9  Decreased Interest 3 0 _0 Down, Depressed, Hopeless 0 0 0 0 0  PHQ - 2 Score 3 0 _1 Altered sleeping 0 0 0 0 3  Tired, decreased energy _2 Change in appetite 0 3 0 0 3  Feeling bad or failure about yourself  0 0 0 0 0  Trouble concentrating 0 0 0 0 0  Moving slowly or fidgety/restless 0 0 0 0 0  Suicidal thoughts 0 0 0 0 0  PHQ-9 Score _3 Difficult doing work/chores Not difficult at all Not difficult at all   Somewhat difficult      Fall Risk:    06/06/2022    3:42 PM 05/01/2022    8:31 AM 09/02/2021    2:15 PM 08/19/2019   11:16 AM 02/09/2018    3:17 PM  Fall Risk   Falls in the past year? 0 0 0 0 No  Number falls in past yr: 0 0 0 0   Injury with Fall? 0 0 0 0   Risk for fall due to : No Fall Risks No Fall Risks No Fall Risks    Follow up Falls evaluation completed Falls evaluation completed Falls evaluation completed        Functional Status Survey:      Assessment & Plan  Problem List Items Addressed This Visit        Other   Numbness and tingling of both feet - Primary    Chronic per patient reports, ongoing Reports symmetric numbness and tingling of lower extremities for almost a year  Reviewed A1c results from Nov 2023 with patient - unlikely DM neuropathy as he is not uncontrolled diabetic with A1c at 6.1  Will check CMP, CBC, B12, thiamine, TSH today for potential etiology  Reviewed side effects of all listed medications and Diltiazem, Furosemide, and Losartan have paresthesia listed as potential adverse effect per UTD - cannot rule these out as contributory Will place referral to Neurology as well for further evaluation and management assistance Results of labs to further dictate management  Follow up in 2-4 weeks to assess progress with specialty and discuss further management      Relevant Orders   B12   CBC  w/Diff   Comp Met (CMET)   Vitamin B1   Vitamin D (25 hydroxy)   Ambulatory referral to Neurology   TSH     No follow-ups on file.   I, Taishawn Smaldone E Arieon Scalzo, PA-C, have reviewed all documentation for this visit. The documentation on 06/06/22 for the exam, diagnosis, procedures, and orders are all accurate and complete.   Talitha Givens, MHS, PA-C [facility]

## 2022-06-06 NOTE — Assessment & Plan Note (Signed)
Chronic per patient reports, ongoing Reports symmetric numbness and tingling of lower extremities for almost a year  Reviewed A1c results from Nov 2023 with patient - unlikely DM neuropathy as he is not uncontrolled diabetic with A1c at 6.1  Will check CMP, CBC, B12, thiamine, TSH today for potential etiology  Reviewed side effects of all listed medications and Diltiazem, Furosemide, and Losartan have paresthesia listed as potential adverse effect per UTD - cannot rule these out as contributory Will place referral to Neurology as well for further evaluation and management assistance Results of labs to further dictate management  Follow up in 2-4 weeks to assess progress with specialty and discuss further management

## 2022-06-12 LAB — COMPREHENSIVE METABOLIC PANEL
ALT: 16 IU/L (ref 0–44)
AST: 23 IU/L (ref 0–40)
Albumin/Globulin Ratio: 1.5 (ref 1.2–2.2)
Albumin: 4.3 g/dL (ref 3.9–4.9)
Alkaline Phosphatase: 86 IU/L (ref 44–121)
BUN/Creatinine Ratio: 11 (ref 10–24)
BUN: 17 mg/dL (ref 8–27)
Bilirubin Total: 0.5 mg/dL (ref 0.0–1.2)
CO2: 20 mmol/L (ref 20–29)
Calcium: 9.6 mg/dL (ref 8.6–10.2)
Chloride: 101 mmol/L (ref 96–106)
Creatinine, Ser: 1.52 mg/dL — ABNORMAL HIGH (ref 0.76–1.27)
Globulin, Total: 2.9 g/dL (ref 1.5–4.5)
Glucose: 92 mg/dL (ref 70–99)
Potassium: 4.2 mmol/L (ref 3.5–5.2)
Sodium: 136 mmol/L (ref 134–144)
Total Protein: 7.2 g/dL (ref 6.0–8.5)
eGFR: 51 mL/min/{1.73_m2} — ABNORMAL LOW (ref >59)

## 2022-06-12 LAB — VITAMIN B1: Thiamine: 111.7 nmol/L (ref 66.5–200.0)

## 2022-06-12 LAB — CBC WITH DIFFERENTIAL/PLATELET
Basophils Absolute: 0 10*3/uL (ref 0.0–0.2)
Basos: 1 %
EOS (ABSOLUTE): 0.2 10*3/uL (ref 0.0–0.4)
Eos: 4 %
Hematocrit: 46.9 % (ref 37.5–51.0)
Hemoglobin: 16.6 g/dL (ref 13.0–17.7)
Immature Grans (Abs): 0 10*3/uL (ref 0.0–0.1)
Immature Granulocytes: 0 %
Lymphocytes Absolute: 1.4 10*3/uL (ref 0.7–3.1)
Lymphs: 42 %
MCH: 30.9 pg (ref 26.6–33.0)
MCHC: 35.4 g/dL (ref 31.5–35.7)
MCV: 87 fL (ref 79–97)
Monocytes Absolute: 0.7 10*3/uL (ref 0.1–0.9)
Monocytes: 21 %
Neutrophils Absolute: 1.1 10*3/uL — ABNORMAL LOW (ref 1.4–7.0)
Neutrophils: 32 %
Platelets: 214 10*3/uL (ref 150–450)
RBC: 5.38 x10E6/uL (ref 4.14–5.80)
RDW: 12.3 % (ref 11.6–15.4)
WBC: 3.5 10*3/uL (ref 3.4–10.8)

## 2022-06-12 LAB — TSH: TSH: 2.22 u[IU]/mL (ref 0.450–4.500)

## 2022-06-12 LAB — VITAMIN D 25 HYDROXY (VIT D DEFICIENCY, FRACTURES): Vit D, 25-Hydroxy: 17.4 ng/mL — ABNORMAL LOW (ref 30.0–100.0)

## 2022-06-12 LAB — VITAMIN B12: Vitamin B-12: 273 pg/mL (ref 232–1245)

## 2022-06-23 DIAGNOSIS — Z419 Encounter for procedure for purposes other than remedying health state, unspecified: Secondary | ICD-10-CM | POA: Diagnosis not present

## 2022-06-25 DIAGNOSIS — R202 Paresthesia of skin: Secondary | ICD-10-CM | POA: Diagnosis not present

## 2022-06-25 DIAGNOSIS — I10 Essential (primary) hypertension: Secondary | ICD-10-CM | POA: Diagnosis not present

## 2022-06-25 DIAGNOSIS — I4819 Other persistent atrial fibrillation: Secondary | ICD-10-CM | POA: Diagnosis not present

## 2022-06-25 DIAGNOSIS — R200 Anesthesia of skin: Secondary | ICD-10-CM | POA: Diagnosis not present

## 2022-06-25 DIAGNOSIS — E559 Vitamin D deficiency, unspecified: Secondary | ICD-10-CM | POA: Diagnosis not present

## 2022-06-25 DIAGNOSIS — I420 Dilated cardiomyopathy: Secondary | ICD-10-CM | POA: Diagnosis not present

## 2022-07-24 DIAGNOSIS — Z419 Encounter for procedure for purposes other than remedying health state, unspecified: Secondary | ICD-10-CM | POA: Diagnosis not present

## 2022-07-30 ENCOUNTER — Telehealth: Payer: Medicaid Other | Admitting: Gastroenterology

## 2022-07-31 ENCOUNTER — Other Ambulatory Visit: Payer: Self-pay

## 2022-07-31 ENCOUNTER — Encounter: Payer: Self-pay | Admitting: Gastroenterology

## 2022-07-31 ENCOUNTER — Ambulatory Visit (INDEPENDENT_AMBULATORY_CARE_PROVIDER_SITE_OTHER): Payer: Medicaid Other | Admitting: Gastroenterology

## 2022-07-31 VITALS — BP 139/84 | HR 83 | Temp 98.4°F | Ht 75.0 in | Wt 262.0 lb

## 2022-07-31 DIAGNOSIS — B182 Chronic viral hepatitis C: Secondary | ICD-10-CM

## 2022-07-31 NOTE — Progress Notes (Signed)
    Gastroenterology Consultation  Referring Provider:     Jon Billings, NP Primary Care Physician:  Jon Billings, NP Primary Gastroenterologist:  Dr. Allen Norris     Reason for Consultation:     Hepatitis C        HPI:   Dwayne Ramirez is a 62 y.o. y/o male referred for consultation & management of hepatitis C by Dr. Jon Billings, NP.  This patient had seen me in the past with hepatitis C positive with a fibrosis score of F2.  Patient does not appear to have had treatment since then.  The patient has no complaints at the present time but would like to be started on treatment.  The patient denies any alcohol abuse.  He also denies any acute issues.  He thinks he was treated for hepatitis C back in the 1990s.  Past Medical History:  Diagnosis Date   A-fib [facility])    Acute on chronic left systolic heart failure ([place]) 03/2016   Dilated cardiomyopathy ([place])    Hypertension    Obesity     No past surgical history on file.  Prior to Admission medications   Medication Sig Start Date End Date Taking? Authorizing Provider  furosemide (LASIX) 40 MG tablet Take by mouth. 05/29/22  Yes [provider]  metoprolol succinate (TOPROL-XL) 50 MG 24 hr tablet Take 1 tablet by mouth daily. 05/29/22  Yes [provider]  spironolactone (ALDACTONE) 25 MG tablet Take by mouth. 05/29/22  Yes [provider]    Family History  Problem Relation Age of Onset   Hypertension Mother    Hypertension Father    Cancer Neg Hx    Diabetes Neg Hx    COPD Neg Hx    Heart disease Neg Hx    Stroke Neg Hx      Social History   Tobacco Use   Smoking status: Never   Smokeless tobacco: Never  Vaping Use   Vaping Use: Never used  Substance Use Topics   Alcohol use: No   Drug use: No    Allergies as of 07/31/2022   (No Known Allergies)    Review of Systems:    All systems reviewed and negative except where noted in HPI.   Physical Exam:  There were no vitals taken for  this visit. No LMP for male patient. General:   Alert,  Well-developed, well-nourished, pleasant and cooperative in NAD Head:  Normocephalic and atraumatic. Eyes:  Sclera clear, no icterus.   Conjunctiva pink. Ears:  Normal auditory acuity. Skin:  Intact without significant lesions or rashes.  No jaundice. Lymph Nodes:  No significant cervical adenopathy. Psych:  Alert and cooperative. Normal mood and affect.  Imaging Studies: No results found.  Assessment and Plan:   Dwayne Ramirez is a 62 y.o. y/o male who has a history of hepatitis C.  The patient is not sure if he is ever been treated.  The patient will have his viral load checked again today and will also have a fibrosis score checked again.  If the patient's viral level comes back positive then the patient will be recommended again to start treatment.    Lucilla Lame, MD. Mar[facility] Regal    Note: This dictation was prepared with Dragon dictation along with smaller phrase technology. Any transcriptional errors that result from this process are unintentional.

## 2022-08-02 LAB — HCV FIBROSURE
ALPHA 2-MACROGLOBULINS, QN: 320 mg/dL — ABNORMAL HIGH (ref 110–276)
ALT (SGPT) P5P: 13 IU/L (ref 0–55)
Apolipoprotein A-1: 99 mg/dL — ABNORMAL LOW (ref 101–178)
Bilirubin, Total: 0.4 mg/dL (ref 0.0–1.2)
Fibrosis Score: 0.54 — ABNORMAL HIGH (ref 0.00–0.21)
GGT: 24 IU/L (ref 0–65)
Haptoglobin: 161 mg/dL (ref 32–363)
Necroinflammat Activity Score: 0.06 (ref 0.00–0.17)

## 2022-08-02 LAB — HCV RNA QUANT: Hepatitis C Quantitation: NOT DETECTED IU/mL

## 2022-08-22 DIAGNOSIS — Z419 Encounter for procedure for purposes other than remedying health state, unspecified: Secondary | ICD-10-CM | POA: Diagnosis not present

## 2022-08-29 DIAGNOSIS — R202 Paresthesia of skin: Secondary | ICD-10-CM | POA: Diagnosis not present

## 2022-08-29 DIAGNOSIS — R200 Anesthesia of skin: Secondary | ICD-10-CM | POA: Diagnosis not present

## 2022-08-29 DIAGNOSIS — R7303 Prediabetes: Secondary | ICD-10-CM | POA: Diagnosis not present

## 2022-08-29 DIAGNOSIS — E569 Vitamin deficiency, unspecified: Secondary | ICD-10-CM | POA: Diagnosis not present

## 2022-09-22 DIAGNOSIS — Z419 Encounter for procedure for purposes other than remedying health state, unspecified: Secondary | ICD-10-CM | POA: Diagnosis not present

## 2022-10-15 DIAGNOSIS — I4819 Other persistent atrial fibrillation: Secondary | ICD-10-CM | POA: Diagnosis not present

## 2022-10-15 DIAGNOSIS — E559 Vitamin D deficiency, unspecified: Secondary | ICD-10-CM | POA: Diagnosis not present

## 2022-10-15 DIAGNOSIS — R0683 Snoring: Secondary | ICD-10-CM | POA: Diagnosis not present

## 2022-10-15 DIAGNOSIS — I10 Essential (primary) hypertension: Secondary | ICD-10-CM | POA: Diagnosis not present

## 2022-10-15 DIAGNOSIS — G629 Polyneuropathy, unspecified: Secondary | ICD-10-CM | POA: Diagnosis not present

## 2022-10-22 DIAGNOSIS — Z419 Encounter for procedure for purposes other than remedying health state, unspecified: Secondary | ICD-10-CM | POA: Diagnosis not present

## 2022-10-30 ENCOUNTER — Encounter: Payer: Self-pay | Admitting: Nurse Practitioner

## 2022-10-30 ENCOUNTER — Ambulatory Visit (INDEPENDENT_AMBULATORY_CARE_PROVIDER_SITE_OTHER): Payer: Medicaid Other | Admitting: Nurse Practitioner

## 2022-10-30 VITALS — BP 122/80 | HR 79 | Temp 98.2°F | Ht 75.2 in | Wt 267.2 lb

## 2022-10-30 DIAGNOSIS — I480 Paroxysmal atrial fibrillation: Secondary | ICD-10-CM | POA: Diagnosis not present

## 2022-10-30 DIAGNOSIS — N1831 Chronic kidney disease, stage 3a: Secondary | ICD-10-CM | POA: Diagnosis not present

## 2022-10-30 DIAGNOSIS — Z125 Encounter for screening for malignant neoplasm of prostate: Secondary | ICD-10-CM | POA: Diagnosis not present

## 2022-10-30 DIAGNOSIS — I10 Essential (primary) hypertension: Secondary | ICD-10-CM

## 2022-10-30 DIAGNOSIS — Z0000 Encounter for general adult medical examination without abnormal findings: Secondary | ICD-10-CM

## 2022-10-30 DIAGNOSIS — R7303 Prediabetes: Secondary | ICD-10-CM | POA: Diagnosis not present

## 2022-10-30 DIAGNOSIS — Z136 Encounter for screening for cardiovascular disorders: Secondary | ICD-10-CM

## 2022-10-30 DIAGNOSIS — I5022 Chronic systolic (congestive) heart failure: Secondary | ICD-10-CM | POA: Diagnosis not present

## 2022-10-30 DIAGNOSIS — B182 Chronic viral hepatitis C: Secondary | ICD-10-CM | POA: Diagnosis not present

## 2022-10-30 LAB — URINALYSIS, ROUTINE W REFLEX MICROSCOPIC
Bilirubin, UA: NEGATIVE
Glucose, UA: NEGATIVE
Ketones, UA: NEGATIVE
Leukocytes,UA: NEGATIVE
Nitrite, UA: NEGATIVE
RBC, UA: NEGATIVE
Specific Gravity, UA: >1.03 — ABNORMAL HIGH (ref 1.005–1.030)
Urobilinogen, Ur: 1 mg/dL (ref 0.2–1.0)
pH, UA: 5 (ref 5.0–7.5)

## 2022-10-30 LAB — MICROSCOPIC EXAMINATION: Bacteria, UA: NONE SEEN

## 2022-10-30 NOTE — Assessment & Plan Note (Signed)
Chronic.  Rate controlled.  Continue with Metoprolol and Diltilizem.  On Xerelto for anticoagulation. Continue to follow up with Cardiology.  Next appt should be in September.  Labs ordered today.

## 2022-10-30 NOTE — Progress Notes (Signed)
BP 122/80   Pulse 79   Temp 98.2 F (36.8 C) (Oral)   Ht 6' 3.2" (1.91 m)   Wt 267 lb 3.2 oz (121.2 kg)   SpO2 99%   BMI 33.22 kg/m    Subjective:    Patient ID: Dwayne Ramirez, male    DOB: 03/19/1961, 62 y.o.   MRN: [id number]  HPI: Dwayne Ramirez is a 62 y.o. male presenting on 10/30/2022 for comprehensive medical examination. Current medical complaints include:none  He currently lives with: Interim Problems from his last visit: no  CHF/HYPERTENSION with Chronic Kidney Disease Continues to follow up with Dr. Gwen Pounds.   Hypertension status: controlled  Satisfied with current treatment? yes Duration of hypertension: years BP monitoring frequency:  daily BP range: 120-130/80 BP medication side effects:  no Medication compliance: excellent compliance Previous BP meds:losartan (cozaar), Lasix, Metoprolol, Diltalizem Aspirin: no Recurrent headaches: no Visual changes: no Palpitations: no Dyspnea: no Chest pain: no Lower extremity edema: no Dizzy/lightheaded: no   CHRONIC KIDNEY DISEASE CKD status: controlled Medications renally dose: yes Previous renal evaluation: no Pneumovax:  Not up to Date Influenza Vaccine:  Not up to Date  Depression Screen done today and results listed below:     10/30/2022   10:06 AM 06/06/2022    3:42 PM 05/01/2022    8:31 AM 09/02/2021    2:28 PM 08/19/2019   11:16 AM  Depression screen PHQ 2/9  Decreased Interest 3 3 0 3 3  Down, Depressed, Hopeless 0 0 0 0 0  PHQ - 2 Score 3 3 0 3 3  Altered sleeping 0 0 0 0 0  Tired, decreased energy 3 3 3 3 3   Change in appetite 0 0 3 0 0  Feeling bad or failure about yourself  0 0 0 0 0  Trouble concentrating 0 0 0 0 0  Moving slowly or fidgety/restless 0 0 0 0 0  Suicidal thoughts 0 0 0 0 0  PHQ-9 Score 6 6 6 6 6   Difficult doing work/chores  Not difficult at all Not difficult at all      The patient does not have a history of falls. I did complete a risk assessment for falls. A plan of care  for falls was documented.   Past Medical History:  Past Medical History:  Diagnosis Date   A-fib (HCC)    Acute on chronic left systolic heart failure (HCC) 03/2016   Dilated cardiomyopathy (HCC)    Hypertension    Obesity     Surgical History:  History reviewed. No pertinent surgical history.  Medications:  Current Outpatient Medications on File Prior to Visit  Medication Sig   diltiazem (CARDIZEM SR) 90 MG 12 hr capsule Take 90 mg by mouth 2 (two) times daily.   furosemide (LASIX) 40 MG tablet Take by mouth.   metoprolol succinate (TOPROL-XL) 50 MG 24 hr tablet Take 1 tablet by mouth daily.   spironolactone (ALDACTONE) 25 MG tablet Take by mouth.   XARELTO 20 MG TABS tablet Take 20 mg by mouth daily.   No current facility-administered medications on file prior to visit.    Allergies:  No Known Allergies  Social History:  Social History   Socioeconomic History   Marital status: Single    Spouse name: Not on file   Number of children: Not on file   Years of education: Not on file   Highest education level: Not on file  Occupational History   Not on file  Tobacco  Use   Smoking status: Never   Smokeless tobacco: Never  Vaping Use   Vaping Use: Never used  Substance and Sexual Activity   Alcohol use: No   Drug use: No   Sexual activity: Not Currently  Other Topics Concern   Not on file  Social History Narrative   Not on file   Social Determinants of Health   Financial Resource Strain: Not on file  Food Insecurity: Not on file  Transportation Needs: Not on file  Physical Activity: Not on file  Stress: Not on file  Social Connections: Not on file  Intimate Partner Violence: Not on file   Social History   Tobacco Use  Smoking Status Never  Smokeless Tobacco Never   Social History   Substance and Sexual Activity  Alcohol Use No    Family History:  Family History  Problem Relation Age of Onset   Hypertension Mother    Hypertension Father     Cancer Neg Hx    Diabetes Neg Hx    COPD Neg Hx    Heart disease Neg Hx    Stroke Neg Hx     Past medical history, surgical history, medications, allergies, family history and social history reviewed with patient today and changes made to appropriate areas of the chart.   Review of Systems  Eyes:  Negative for blurred vision and double vision.  Respiratory:  Negative for shortness of breath.   Cardiovascular:  Negative for chest pain, palpitations and leg swelling.  Neurological:  Negative for dizziness and headaches.   All other ROS negative except what is listed above and in the HPI.      Objective:    BP 122/80   Pulse 79   Temp 98.2 F (36.8 C) (Oral)   Ht 6' 3.2" (1.91 m)   Wt 267 lb 3.2 oz (121.2 kg)   SpO2 99%   BMI 33.22 kg/m   Wt Readings from Last 3 Encounters:  10/30/22 267 lb 3.2 oz (121.2 kg)  07/31/22 262 lb (118.8 kg)  06/06/22 259 lb 14.4 oz (117.9 kg)    Physical Exam Vitals and nursing note reviewed.  Constitutional:      General: He is not in acute distress.    Appearance: Normal appearance. He is not ill-appearing, toxic-appearing or diaphoretic.  HENT:     Head: Normocephalic.     Right Ear: Tympanic membrane, ear canal and external ear normal.     Left Ear: Tympanic membrane, ear canal and external ear normal.     Nose: Nose normal. No congestion or rhinorrhea.     Mouth/Throat:     Mouth: Mucous membranes are moist.  Eyes:     General:        Right eye: No discharge.        Left eye: No discharge.     Extraocular Movements: Extraocular movements intact.     Conjunctiva/sclera: Conjunctivae normal.     Pupils: Pupils are equal, round, and reactive to light.  Cardiovascular:     Rate and Rhythm: Normal rate and regular rhythm.     Heart sounds: No murmur heard. Pulmonary:     Effort: Pulmonary effort is normal. No respiratory distress.     Breath sounds: Normal breath sounds. No wheezing, rhonchi or rales.  Abdominal:     General:  Abdomen is flat. Bowel sounds are normal. There is no distension.     Palpations: Abdomen is soft.     Tenderness: There is no  abdominal tenderness. There is no guarding.  Musculoskeletal:     Cervical back: Normal range of motion and neck supple.  Skin:    General: Skin is warm and dry.     Capillary Refill: Capillary refill takes less than 2 seconds.  Neurological:     General: No focal deficit present.     Mental Status: He is alert and oriented to person, place, and time.     Cranial Nerves: No cranial nerve deficit.     Motor: No weakness.     Deep Tendon Reflexes: Reflexes normal.  Psychiatric:        Mood and Affect: Mood normal.        Behavior: Behavior normal.        Thought Content: Thought content normal.        Judgment: Judgment normal.     Results for orders placed or performed in visit on 07/31/22  HCV FibroSURE  Result Value Ref Range   Fibrosis Score 0.54 (H) 0.00 - 0.21   Fibrosis Stage Comment    Necroinflammat Activity Score 0.06 0.00 - 0.17   Necroinflammat Activity Grade A0-No activity    Methodology: Comment    ALPHA 2-MACROGLOBULINS, QN 320 (H) 110 - 276 mg/dL   Haptoglobin 161 32 - 363 mg/dL   Apolipoprotein A-1 99 (L) 101 - 178 mg/dL   Bilirubin, Total 0.4 0.0 - 1.2 mg/dL   GGT 24 0 - 65 IU/L   ALT (SGPT) P5P 13 0 - 55 IU/L   Interpretations: Comment    Fibrosis Scoring: Comment    Necroinflamm Activity Scoring: Comment    Limitations: Comment    Comment Comment   HCV RNA quant  Result Value Ref Range   Hepatitis C Quantitation HCV Not Detected IU/mL   Test Information Comment       Assessment & Plan:   Problem List Items Addressed This Visit       Cardiovascular and Mediastinum   Paroxysmal atrial fibrillation (HCC)    Chronic.  Rate controlled.  Continue with Metoprolol and Diltilizem.  On Xerelto for anticoagulation. Continue to follow up with Cardiology.  Next appt should be in September.  Labs ordered today.       Relevant  Medications   diltiazem (CARDIZEM SR) 90 MG 12 hr capsule   XARELTO 20 MG TABS tablet   Essential (primary) hypertension    Chronic.  Controlled.  Continue with current medication regimen.  Labs ordered today.  Return to clinic in 6 months for reevaluation.  Call sooner if concerns arise.        Relevant Medications   diltiazem (CARDIZEM SR) 90 MG 12 hr capsule   XARELTO 20 MG TABS tablet   Chronic HFrEF (heart failure with reduced ejection fraction) (HCC)    Chronic. Controlled.  EF is 40-45%.  Continue with current regimen of Cardizem and metoprolol.  Continue to follow up with Cardiology.  Labs ordered today.  Follow up in 6 months.  Call sooner if concerns arise.        Relevant Medications   diltiazem (CARDIZEM SR) 90 MG 12 hr capsule   XARELTO 20 MG TABS tablet     Digestive   Hepatitis C, chronic (HCC)    Completed treatment.  Has seen GI.  No need for further treatment.          Genitourinary   Stage 3a chronic kidney disease (HCC)    Labs ordered at visit today.  Will make recommendations based  on lab results.          Other   Prediabetes    Labs ordered at visit today.  Will make recommendations based on lab results.        Relevant Orders   HgB A1c   Other Visit Diagnoses     Annual physical exam    -  Primary   Health maintenance reviewed during visit today.  Labs ordered.  Declined vaccines.  Colon cancer screening up to date.   Relevant Orders   HgB A1c   TSH   PSA   Lipid panel   CBC with Differential/Platelet   Comprehensive metabolic panel   Urinalysis, Routine w reflex microscopic   Screening for ischemic heart disease       Relevant Orders   Lipid panel        Discussed aspirin prophylaxis for myocardial infarction prevention and decision was  on Xerelto  LABORATORY TESTING:  Health maintenance labs ordered today as discussed above.   The natural history of prostate cancer and ongoing controversy regarding screening and potential  treatment outcomes of prostate cancer has been discussed with the patient. The meaning of a false positive PSA and a false negative PSA has been discussed. He indicates understanding of the limitations of this screening test and wishes to proceed with screening PSA testing.   IMMUNIZATIONS:   - Tdap: Tetanus vaccination status reviewed: last tetanus booster within 10 years. - Influenza: Refused - Pneumovax: Refused - Prevnar: Not applicable - COVID: Refused - HPV: Not applicable - Shingrix vaccine: Refused  SCREENING: - Colonoscopy: up to date Discussed with patient purpose of the colonoscopy is to detect colon cancer at curable precancerous or early stages   - AAA Screening: Not applicable  -Hearing Test: Not applicable  -Spirometry: Not applicable   PATIENT COUNSELING:    Sexuality: Discussed sexually transmitted diseases, partner selection, use of condoms, avoidance of unintended pregnancy  and contraceptive alternatives.   Advised to avoid cigarette smoking.  I discussed with the patient that most people either abstain from alcohol or drink within safe limits (<=14/week and <=4 drinks/occasion for males, <=7/weeks and <= 3 drinks/occasion for females) and that the risk for alcohol disorders and other health effects rises proportionally with the number of drinks per week and how often a drinker exceeds daily limits.  Discussed cessation/primary prevention of drug use and availability of treatment for abuse.   Diet: Encouraged to adjust caloric intake to maintain  or achieve ideal body weight, to reduce intake of dietary saturated fat and total fat, to limit sodium intake by avoiding high sodium foods and not adding table salt, and to maintain adequate dietary potassium and calcium preferably from fresh fruits, vegetables, and low-fat dairy products.    stressed the importance of regular exercise  Injury prevention: Discussed safety belts, safety helmets, smoke detector, smoking  near bedding or upholstery.   Dental health: Discussed importance of regular tooth brushing, flossing, and dental visits.   Follow up plan: NEXT PREVENTATIVE PHYSICAL DUE IN 1 YEAR. Return in about 6 months (around 05/02/2023) for HTN, HLD, DM2 FU.

## 2022-10-30 NOTE — Assessment & Plan Note (Signed)
Chronic.  Controlled.  Continue with current medication regimen.  Labs ordered today.  Return to clinic in 6 months for reevaluation.  Call sooner if concerns arise.  ? ?

## 2022-10-30 NOTE — Assessment & Plan Note (Signed)
Completed treatment.  Has seen GI.  No need for further treatment.

## 2022-10-30 NOTE — Assessment & Plan Note (Signed)
Labs ordered at visit today.  Will make recommendations based on lab results.   

## 2022-10-30 NOTE — Assessment & Plan Note (Signed)
Chronic. Controlled.  EF is 40-45%.  Continue with current regimen of Cardizem and metoprolol.  Continue to follow up with Cardiology.  Labs ordered today.  Follow up in 6 months.  Call sooner if concerns arise.

## 2022-10-31 LAB — PSA: Prostate Specific Ag, Serum: 0.4 ng/mL (ref 0.0–4.0)

## 2022-10-31 LAB — CBC WITH DIFFERENTIAL/PLATELET
Basophils Absolute: 0 10*3/uL (ref 0.0–0.2)
Basos: 1 %
EOS (ABSOLUTE): 0.2 10*3/uL (ref 0.0–0.4)
Eos: 4 %
Hematocrit: 51.2 % — ABNORMAL HIGH (ref 37.5–51.0)
Hemoglobin: 16.9 g/dL (ref 13.0–17.7)
Immature Grans (Abs): 0 10*3/uL (ref 0.0–0.1)
Immature Granulocytes: 0 %
Lymphocytes Absolute: 2.2 10*3/uL (ref 0.7–3.1)
Lymphs: 44 %
MCH: 30.3 pg (ref 26.6–33.0)
MCHC: 33 g/dL (ref 31.5–35.7)
MCV: 92 fL (ref 79–97)
Monocytes Absolute: 0.5 10*3/uL (ref 0.1–0.9)
Monocytes: 10 %
Neutrophils Absolute: 2 10*3/uL (ref 1.4–7.0)
Neutrophils: 41 %
Platelets: 271 10*3/uL (ref 150–450)
RBC: 5.58 x10E6/uL (ref 4.14–5.80)
RDW: 13.5 % (ref 11.6–15.4)
WBC: 5 10*3/uL (ref 3.4–10.8)

## 2022-10-31 LAB — LIPID PANEL
Chol/HDL Ratio: 3.9 ratio (ref 0.0–5.0)
Cholesterol, Total: 142 mg/dL (ref 100–199)
HDL: 36 mg/dL — ABNORMAL LOW (ref >39)
LDL Chol Calc (NIH): 89 mg/dL (ref 0–99)
Triglycerides: 87 mg/dL (ref 0–149)
VLDL Cholesterol Cal: 17 mg/dL (ref 5–40)

## 2022-10-31 LAB — COMPREHENSIVE METABOLIC PANEL
ALT: 17 IU/L (ref 0–44)
AST: 18 IU/L (ref 0–40)
Albumin/Globulin Ratio: 1.6 (ref 1.2–2.2)
Albumin: 4.5 g/dL (ref 3.9–4.9)
Alkaline Phosphatase: 107 IU/L (ref 44–121)
BUN/Creatinine Ratio: 8 — ABNORMAL LOW (ref 10–24)
BUN: 12 mg/dL (ref 8–27)
Bilirubin Total: 0.4 mg/dL (ref 0.0–1.2)
CO2: 20 mmol/L (ref 20–29)
Calcium: 10.4 mg/dL — ABNORMAL HIGH (ref 8.6–10.2)
Chloride: 103 mmol/L (ref 96–106)
Creatinine, Ser: 1.51 mg/dL — ABNORMAL HIGH (ref 0.76–1.27)
Globulin, Total: 2.8 g/dL (ref 1.5–4.5)
Glucose: 135 mg/dL — ABNORMAL HIGH (ref 70–99)
Potassium: 4.5 mmol/L (ref 3.5–5.2)
Sodium: 140 mmol/L (ref 134–144)
Total Protein: 7.3 g/dL (ref 6.0–8.5)
eGFR: 52 mL/min/{1.73_m2} — ABNORMAL LOW (ref >59)

## 2022-10-31 LAB — HEMOGLOBIN A1C
Est. average glucose Bld gHb Est-mCnc: 134 mg/dL
Hgb A1c MFr Bld: 6.3 % — ABNORMAL HIGH (ref 4.8–5.6)

## 2022-10-31 LAB — TSH: TSH: 2.14 u[IU]/mL (ref 0.450–4.500)

## 2022-10-31 NOTE — Progress Notes (Signed)
Hi Mr. Dwayne Ramirez. It was nice to see you yesterday.  Your lab work looks good.  Your A1c is controlled at 6.3%.  Your kidney function remains stable.  No concerns at this time. Continue with your current medication regimen.  Follow up as discussed.  Please let me know if you have any questions.

## 2022-11-04 DIAGNOSIS — I420 Dilated cardiomyopathy: Secondary | ICD-10-CM | POA: Diagnosis not present

## 2022-11-04 DIAGNOSIS — I10 Essential (primary) hypertension: Secondary | ICD-10-CM | POA: Diagnosis not present

## 2022-11-04 DIAGNOSIS — I4819 Other persistent atrial fibrillation: Secondary | ICD-10-CM | POA: Diagnosis not present

## 2022-11-04 DIAGNOSIS — G609 Hereditary and idiopathic neuropathy, unspecified: Secondary | ICD-10-CM | POA: Diagnosis not present

## 2022-11-22 DIAGNOSIS — Z419 Encounter for procedure for purposes other than remedying health state, unspecified: Secondary | ICD-10-CM | POA: Diagnosis not present

## 2022-12-22 DIAGNOSIS — Z419 Encounter for procedure for purposes other than remedying health state, unspecified: Secondary | ICD-10-CM | POA: Diagnosis not present

## 2023-01-22 DIAGNOSIS — Z419 Encounter for procedure for purposes other than remedying health state, unspecified: Secondary | ICD-10-CM | POA: Diagnosis not present

## 2023-02-22 DIAGNOSIS — Z419 Encounter for procedure for purposes other than remedying health state, unspecified: Secondary | ICD-10-CM | POA: Diagnosis not present

## 2023-03-04 DIAGNOSIS — I340 Nonrheumatic mitral (valve) insufficiency: Secondary | ICD-10-CM | POA: Diagnosis not present

## 2023-03-04 DIAGNOSIS — I10 Essential (primary) hypertension: Secondary | ICD-10-CM | POA: Diagnosis not present

## 2023-03-04 DIAGNOSIS — I5020 Unspecified systolic (congestive) heart failure: Secondary | ICD-10-CM | POA: Diagnosis not present

## 2023-03-04 DIAGNOSIS — I4811 Longstanding persistent atrial fibrillation: Secondary | ICD-10-CM | POA: Diagnosis not present

## 2023-03-04 DIAGNOSIS — I071 Rheumatic tricuspid insufficiency: Secondary | ICD-10-CM | POA: Diagnosis not present

## 2023-03-17 DIAGNOSIS — I420 Dilated cardiomyopathy: Secondary | ICD-10-CM | POA: Diagnosis not present

## 2023-03-17 DIAGNOSIS — I4819 Other persistent atrial fibrillation: Secondary | ICD-10-CM | POA: Diagnosis not present

## 2023-03-17 DIAGNOSIS — I5020 Unspecified systolic (congestive) heart failure: Secondary | ICD-10-CM | POA: Diagnosis not present

## 2023-03-17 DIAGNOSIS — G609 Hereditary and idiopathic neuropathy, unspecified: Secondary | ICD-10-CM | POA: Diagnosis not present

## 2023-03-17 DIAGNOSIS — I10 Essential (primary) hypertension: Secondary | ICD-10-CM | POA: Diagnosis not present

## 2023-03-17 DIAGNOSIS — Z23 Encounter for immunization: Secondary | ICD-10-CM | POA: Diagnosis not present

## 2023-03-19 DIAGNOSIS — I5020 Unspecified systolic (congestive) heart failure: Secondary | ICD-10-CM | POA: Diagnosis not present

## 2023-03-19 DIAGNOSIS — I071 Rheumatic tricuspid insufficiency: Secondary | ICD-10-CM | POA: Diagnosis not present

## 2023-03-19 DIAGNOSIS — I340 Nonrheumatic mitral (valve) insufficiency: Secondary | ICD-10-CM | POA: Diagnosis not present

## 2023-04-24 DIAGNOSIS — Z419 Encounter for procedure for purposes other than remedying health state, unspecified: Secondary | ICD-10-CM | POA: Diagnosis not present

## 2023-05-04 ENCOUNTER — Ambulatory Visit: Payer: Medicaid Other | Admitting: Nurse Practitioner

## 2023-05-24 DIAGNOSIS — Z419 Encounter for procedure for purposes other than remedying health state, unspecified: Secondary | ICD-10-CM | POA: Diagnosis not present

## 2023-06-03 DIAGNOSIS — I4819 Other persistent atrial fibrillation: Secondary | ICD-10-CM | POA: Diagnosis not present

## 2023-06-03 DIAGNOSIS — I5020 Unspecified systolic (congestive) heart failure: Secondary | ICD-10-CM | POA: Diagnosis not present

## 2023-06-03 DIAGNOSIS — I482 Chronic atrial fibrillation, unspecified: Secondary | ICD-10-CM | POA: Diagnosis not present

## 2023-06-03 DIAGNOSIS — I420 Dilated cardiomyopathy: Secondary | ICD-10-CM | POA: Diagnosis not present

## 2023-06-03 DIAGNOSIS — I071 Rheumatic tricuspid insufficiency: Secondary | ICD-10-CM | POA: Diagnosis not present

## 2023-06-24 DIAGNOSIS — Z419 Encounter for procedure for purposes other than remedying health state, unspecified: Secondary | ICD-10-CM | POA: Diagnosis not present

## 2023-07-21 DIAGNOSIS — G629 Polyneuropathy, unspecified: Secondary | ICD-10-CM | POA: Diagnosis not present

## 2023-07-21 DIAGNOSIS — R42 Dizziness and giddiness: Secondary | ICD-10-CM | POA: Diagnosis not present

## 2023-07-21 DIAGNOSIS — R7303 Prediabetes: Secondary | ICD-10-CM | POA: Diagnosis not present

## 2023-07-22 ENCOUNTER — Other Ambulatory Visit: Payer: Self-pay | Admitting: Student

## 2023-07-22 DIAGNOSIS — R42 Dizziness and giddiness: Secondary | ICD-10-CM

## 2023-07-25 DIAGNOSIS — Z419 Encounter for procedure for purposes other than remedying health state, unspecified: Secondary | ICD-10-CM | POA: Diagnosis not present

## 2023-07-27 ENCOUNTER — Ambulatory Visit: Payer: Medicaid Other

## 2023-07-28 ENCOUNTER — Other Ambulatory Visit: Payer: Self-pay | Admitting: Student

## 2023-07-28 DIAGNOSIS — R42 Dizziness and giddiness: Secondary | ICD-10-CM

## 2023-08-03 ENCOUNTER — Ambulatory Visit
Admission: RE | Admit: 2023-08-03 | Discharge: 2023-08-03 | Disposition: A | Discharge status: Home / Self Care | Payer: Medicaid Other | Source: Ambulatory Visit | Attending: Student | Admitting: Student

## 2023-08-03 DIAGNOSIS — R42 Dizziness and giddiness: Secondary | ICD-10-CM

## 2023-08-03 DIAGNOSIS — R9082 White matter disease, unspecified: Secondary | ICD-10-CM | POA: Diagnosis not present

## 2023-08-03 DIAGNOSIS — G319 Degenerative disease of nervous system, unspecified: Secondary | ICD-10-CM | POA: Diagnosis not present

## 2023-08-03 DIAGNOSIS — R200 Anesthesia of skin: Secondary | ICD-10-CM | POA: Diagnosis not present

## 2023-08-22 DIAGNOSIS — Z419 Encounter for procedure for purposes other than remedying health state, unspecified: Secondary | ICD-10-CM | POA: Diagnosis not present

## 2023-10-01 DIAGNOSIS — I482 Chronic atrial fibrillation, unspecified: Secondary | ICD-10-CM | POA: Diagnosis not present

## 2023-10-01 DIAGNOSIS — I4819 Other persistent atrial fibrillation: Secondary | ICD-10-CM | POA: Diagnosis not present

## 2023-10-01 DIAGNOSIS — I5020 Unspecified systolic (congestive) heart failure: Secondary | ICD-10-CM | POA: Diagnosis not present

## 2023-10-01 DIAGNOSIS — I071 Rheumatic tricuspid insufficiency: Secondary | ICD-10-CM | POA: Diagnosis not present

## 2023-10-01 DIAGNOSIS — I10 Essential (primary) hypertension: Secondary | ICD-10-CM | POA: Diagnosis not present

## 2023-10-03 DIAGNOSIS — Z419 Encounter for procedure for purposes other than remedying health state, unspecified: Secondary | ICD-10-CM | POA: Diagnosis not present

## 2023-10-15 DIAGNOSIS — R42 Dizziness and giddiness: Secondary | ICD-10-CM | POA: Diagnosis not present

## 2023-10-15 DIAGNOSIS — H7191 Unspecified cholesteatoma, right ear: Secondary | ICD-10-CM | POA: Diagnosis not present

## 2023-10-15 DIAGNOSIS — M5030 Other cervical disc degeneration, unspecified cervical region: Secondary | ICD-10-CM | POA: Diagnosis not present

## 2023-10-15 DIAGNOSIS — G629 Polyneuropathy, unspecified: Secondary | ICD-10-CM | POA: Diagnosis not present

## 2023-10-15 DIAGNOSIS — E559 Vitamin D deficiency, unspecified: Secondary | ICD-10-CM | POA: Diagnosis not present

## 2023-11-02 DIAGNOSIS — Z419 Encounter for procedure for purposes other than remedying health state, unspecified: Secondary | ICD-10-CM | POA: Diagnosis not present

## 2023-11-04 DIAGNOSIS — H90A31 Mixed conductive and sensorineural hearing loss, unilateral, right ear with restricted hearing on the contralateral side: Secondary | ICD-10-CM | POA: Diagnosis not present

## 2023-11-04 DIAGNOSIS — D4819 Other specified neoplasm of uncertain behavior of connective and other soft tissue: Secondary | ICD-10-CM | POA: Diagnosis not present

## 2023-11-05 ENCOUNTER — Other Ambulatory Visit: Payer: Self-pay | Admitting: Otolaryngology

## 2023-11-05 DIAGNOSIS — H90A11 Conductive hearing loss, unilateral, right ear with restricted hearing on the contralateral side: Secondary | ICD-10-CM

## 2023-11-10 ENCOUNTER — Encounter: Payer: Self-pay | Admitting: Otolaryngology

## 2023-11-11 ENCOUNTER — Inpatient Hospital Stay: Admission: RE | Admit: 2023-11-11 | Source: Ambulatory Visit

## 2023-11-13 ENCOUNTER — Inpatient Hospital Stay: Admission: RE | Admit: 2023-11-13 | Source: Ambulatory Visit

## 2023-11-19 ENCOUNTER — Inpatient Hospital Stay: Admission: RE | Admit: 2023-11-19 | Source: Ambulatory Visit

## 2023-12-03 DIAGNOSIS — Z419 Encounter for procedure for purposes other than remedying health state, unspecified: Secondary | ICD-10-CM | POA: Diagnosis not present

## 2024-01-02 DIAGNOSIS — Z419 Encounter for procedure for purposes other than remedying health state, unspecified: Secondary | ICD-10-CM | POA: Diagnosis not present

## 2024-01-14 DIAGNOSIS — Z125 Encounter for screening for malignant neoplasm of prostate: Secondary | ICD-10-CM | POA: Diagnosis not present

## 2024-01-14 DIAGNOSIS — G609 Hereditary and idiopathic neuropathy, unspecified: Secondary | ICD-10-CM | POA: Diagnosis not present

## 2024-01-14 DIAGNOSIS — I420 Dilated cardiomyopathy: Secondary | ICD-10-CM | POA: Diagnosis not present

## 2024-01-14 DIAGNOSIS — B182 Chronic viral hepatitis C: Secondary | ICD-10-CM | POA: Diagnosis not present

## 2024-01-14 DIAGNOSIS — R7303 Prediabetes: Secondary | ICD-10-CM | POA: Diagnosis not present

## 2024-01-14 DIAGNOSIS — I5020 Unspecified systolic (congestive) heart failure: Secondary | ICD-10-CM | POA: Diagnosis not present

## 2024-01-14 DIAGNOSIS — I10 Essential (primary) hypertension: Secondary | ICD-10-CM | POA: Diagnosis not present

## 2024-01-14 DIAGNOSIS — I4819 Other persistent atrial fibrillation: Secondary | ICD-10-CM | POA: Diagnosis not present

## 2024-02-02 DIAGNOSIS — Z419 Encounter for procedure for purposes other than remedying health state, unspecified: Secondary | ICD-10-CM | POA: Diagnosis not present

## 2024-03-04 DIAGNOSIS — Z419 Encounter for procedure for purposes other than remedying health state, unspecified: Secondary | ICD-10-CM | POA: Diagnosis not present

## 2024-03-31 DIAGNOSIS — I5020 Unspecified systolic (congestive) heart failure: Secondary | ICD-10-CM | POA: Diagnosis not present

## 2024-03-31 DIAGNOSIS — I071 Rheumatic tricuspid insufficiency: Secondary | ICD-10-CM | POA: Diagnosis not present

## 2024-03-31 DIAGNOSIS — I10 Essential (primary) hypertension: Secondary | ICD-10-CM | POA: Diagnosis not present

## 2024-03-31 DIAGNOSIS — I482 Chronic atrial fibrillation, unspecified: Secondary | ICD-10-CM | POA: Diagnosis not present

## 2024-04-27 DIAGNOSIS — R319 Hematuria, unspecified: Secondary | ICD-10-CM | POA: Diagnosis not present

## 2024-04-27 DIAGNOSIS — N390 Urinary tract infection, site not specified: Secondary | ICD-10-CM | POA: Diagnosis not present

## 2024-04-27 DIAGNOSIS — R10A1 Flank pain, right side: Secondary | ICD-10-CM | POA: Diagnosis not present

## 2024-05-17 NOTE — Progress Notes (Unsigned)
 05/18/2024 9:36 PM   Lynwood Birmingham 01-04-1961 [id number]  Referring provider: Celestia Burnard MATSU, FNP [address] [place],  [place] [address]  Urological history: 1.   No chief complaint on file.  HPI: Dwayne Ramirez is a 63 y.o. man who presents today for gross hematuria.  Previous records reviewed.  Patient was found to have *** hematuria on *** with *** RBC's/hpf.  Patient *** does/doesn't have a prior history of microscopic hematuria.    She/He does/ does not have a prior history of recurrent urinary tract infections, nephrolithiasis, trauma to the genitourinary tract, BPH or malignancies of the genitourinary tract. ***  She/He does/does not have a family medical history of nephrolithiasis, malignancies of the genitourinary tract or hematuria. ***  She/He are having/not having symptoms of frequent urination, urgency, dysuria, nocturia, incontinence, hesitancy, intermittency, straining to urinate or a weak urinary stream.  Her/His UA today demonstrates ***.  Patient denies any gross hematuria, dysuria or suprapubic/flank pain.  Patient denies any fevers, chills, nausea or vomiting.    She/He have/have not had any recent imaging studies. ***  He/She is/not a smoker. She/He is a former smoker, with a*** ppd history.  Quit *** years ago.  They are/are not exposed to secondhand smoke.  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   ***  He/She has HTN. ***   He/She has a high BMI.     UA ***  Serum creatinine (12/2023) 1.3  Hemoglobin A1c (12/2023) 6.1  PSA (12/2023) 0.37  PMH: Past Medical History:  Diagnosis Date   A-fib (HCC)    Acute on chronic left systolic heart failure (HCC) 03/2016   Dilated cardiomyopathy (HCC)    Hypertension    Obesity     Surgical History: No past surgical history on file.  Home Medications:  Allergies as of 05/18/2024   No Known Allergies      Medication List        Accurate as of  May 17, 2024  9:36 PM. If you have any questions, ask your nurse or doctor.          diltiazem  90 MG 12 hr capsule Commonly known as: CARDIZEM  SR Take 90 mg by mouth 2 (two) times daily.   furosemide  40 MG tablet Commonly known as: LASIX  Take by mouth.   metoprolol  succinate 50 MG 24 hr tablet Commonly known as: TOPROL -XL Take 1 tablet by mouth daily.   spironolactone  25 MG tablet Commonly known as: ALDACTONE  Take by mouth.   Xarelto  20 MG Tabs tablet Generic drug: rivaroxaban  Take 20 mg by mouth daily.        Allergies: No Known Allergies  Family History: Family History  Problem Relation Age of Onset   Hypertension Mother    Hypertension Father    Cancer Neg Hx    Diabetes Neg Hx    COPD Neg Hx    Heart disease Neg Hx    Stroke Neg Hx     Social History:  reports that he has never smoked. He has never used smokeless tobacco. He reports that he does not drink alcohol and does not use drugs.  ROS: Pertinent ROS in HPI  Physical Exam: There were no vitals taken for this visit.  Constitutional:  Well nourished. Alert and oriented, No acute distress. HEENT:  AT, moist mucus membranes.  Trachea midline, no masses. Cardiovascular: No clubbing, cyanosis, or edema. Respiratory: Normal respiratory effort, no increased work of breathing. GI: Abdomen is soft, non  tender, non distended, no abdominal masses. Liver and spleen not palpable.  No hernias appreciated.  Stool sample for occult testing is not indicated.   GU: No CVA tenderness.  No bladder fullness or masses.  Patient with circumcised/uncircumcised phallus. ***Foreskin easily retracted***  Urethral meatus is patent.  No penile discharge. No penile lesions or rashes. Scrotum without lesions, cysts, rashes and/or edema.  Testicles are located scrotally bilaterally. No masses are appreciated in the testicles. Left and right epididymis are normal. Rectal: Patient with  normal sphincter tone. Anus and perineum  without scarring or rashes. No rectal masses are appreciated. Prostate is approximately *** grams, *** nodules are appreciated. Seminal vesicles are normal. Skin: No rashes, bruises or suspicious lesions. Lymph: No cervical or inguinal adenopathy. Neurologic: Grossly intact, no focal deficits, moving all 4 extremities. Psychiatric: Normal mood and affect.  Laboratory Data: See Epic and HPI   I have reviewed the labs.   Pertinent Imaging: N/A  Assessment & Plan:  ***  1. Gross hematuria -We reviewed that hematuria (blood in the urine) may result from a variety of causes, including nephrolithiasis (kidney stones), benign prostatic hyperplasia (BPH)***, urinary tract infections (UTIs), trauma or structural abnormalities of the urinary tract, and malignancy. It was noted that in some cases, no definitive etiology is identified despite thorough evaluation. -We discussed that a CT urogram involves intravenous administration of contrast material to enhance imaging of the urinary tract. Although rare, contrast reactions may occur, with severe reactions estimated at approximately 1 in 100,000. The patient denies any known allergies to iodinated contrast, iodine, or seafood, and is not currently taking metformin. *** -Following imaging, cystoscopy will be performed. This procedure involves passing a small camera through the urethra into the bladder after administration of topical lidocaine for local anesthesia. Post-procedural symptoms may include mild hematuria and dysuria, typically resolving within 24 to 48 hours. -The patient was given the opportunity to ask questions, which were addressed in detail. After reviewing the risks, benefits, and rationale for evaluation, the patient expressed understanding and agreed to proceed. Orders Placed: CT urogram, RUS *** Cystoscopy *** Urinalysis (UA) *** Urine culture *** Basic metabolic panel (BMP) or recent serum creatinine *** Serum pregnancy test (if  applicable) *** Plan: The patient will return for follow-up after completion of the above studies to review findings and determine next steps.      No follow-ups on file.  These notes generated with voice recognition software. I apologize for typographical errors.  CLOTILDA HELON RIGGERS  [facility] [address]  [address] [place], [place] [address] [phone]

## 2024-05-18 ENCOUNTER — Emergency Department

## 2024-05-18 ENCOUNTER — Encounter: Payer: Self-pay | Admitting: Urology

## 2024-05-18 ENCOUNTER — Encounter: Payer: Self-pay | Admitting: *Deleted

## 2024-05-18 ENCOUNTER — Ambulatory Visit: Admitting: Urology

## 2024-05-18 ENCOUNTER — Emergency Department
Admission: EM | Admit: 2024-05-18 | Discharge: 2024-05-18 | Disposition: A | Discharge status: Home / Self Care | Attending: Emergency Medicine | Admitting: Emergency Medicine

## 2024-05-18 ENCOUNTER — Other Ambulatory Visit: Payer: Self-pay

## 2024-05-18 VITALS — BP 148/121 | HR 84 | Wt 267.0 lb

## 2024-05-18 DIAGNOSIS — I110 Hypertensive heart disease with heart failure: Secondary | ICD-10-CM | POA: Insufficient documentation

## 2024-05-18 DIAGNOSIS — R310 Gross hematuria: Secondary | ICD-10-CM | POA: Diagnosis not present

## 2024-05-18 DIAGNOSIS — R079 Chest pain, unspecified: Secondary | ICD-10-CM | POA: Diagnosis not present

## 2024-05-18 DIAGNOSIS — R0789 Other chest pain: Secondary | ICD-10-CM | POA: Insufficient documentation

## 2024-05-18 DIAGNOSIS — I771 Stricture of artery: Secondary | ICD-10-CM | POA: Diagnosis not present

## 2024-05-18 DIAGNOSIS — I10 Essential (primary) hypertension: Secondary | ICD-10-CM | POA: Diagnosis not present

## 2024-05-18 DIAGNOSIS — R030 Elevated blood-pressure reading, without diagnosis of hypertension: Secondary | ICD-10-CM

## 2024-05-18 DIAGNOSIS — I509 Heart failure, unspecified: Secondary | ICD-10-CM | POA: Insufficient documentation

## 2024-05-18 DIAGNOSIS — I517 Cardiomegaly: Secondary | ICD-10-CM | POA: Diagnosis not present

## 2024-05-18 LAB — BASIC METABOLIC PANEL WITH GFR
Anion gap: 11 (ref 5–15)
BUN: 16 mg/dL (ref 8–23)
CO2: 22 mmol/L (ref 22–32)
Calcium: 9.1 mg/dL (ref 8.9–10.3)
Chloride: 105 mmol/L (ref 98–111)
Creatinine, Ser: 1.35 mg/dL — ABNORMAL HIGH (ref 0.61–1.24)
GFR, Estimated: 59 mL/min — ABNORMAL LOW (ref >60)
Glucose, Bld: 102 mg/dL — ABNORMAL HIGH (ref 70–99)
Potassium: 4 mmol/L (ref 3.5–5.1)
Sodium: 138 mmol/L (ref 135–145)

## 2024-05-18 LAB — URINALYSIS, COMPLETE
Bilirubin, UA: NEGATIVE
Glucose, UA: NEGATIVE
Ketones, UA: NEGATIVE
Nitrite, UA: NEGATIVE
RBC, UA: NEGATIVE
Specific Gravity, UA: 1.015 (ref 1.005–1.030)
Urobilinogen, Ur: 2 mg/dL — ABNORMAL HIGH (ref 0.2–1.0)
pH, UA: 7.5 (ref 5.0–7.5)

## 2024-05-18 LAB — TROPONIN T, HIGH SENSITIVITY
Troponin T High Sensitivity: 18 ng/L (ref 0–19)
Troponin T High Sensitivity: 18 ng/L (ref 0–19)

## 2024-05-18 LAB — CBC
HCT: 48.5 % (ref 39.0–52.0)
Hemoglobin: 16.2 g/dL (ref 13.0–17.0)
MCH: 29.6 pg (ref 26.0–34.0)
MCHC: 33.4 g/dL (ref 30.0–36.0)
MCV: 88.7 fL (ref 80.0–100.0)
Platelets: 237 K/uL (ref 150–400)
RBC: 5.47 MIL/uL (ref 4.22–5.81)
RDW: 13.4 % (ref 11.5–15.5)
WBC: 4.9 K/uL (ref 4.0–10.5)
nRBC: 0 % (ref 0.0–0.2)

## 2024-05-18 LAB — MICROSCOPIC EXAMINATION: Bacteria, UA: NONE SEEN

## 2024-05-18 NOTE — Patient Instructions (Signed)

## 2024-05-18 NOTE — Discharge Instructions (Signed)
 You are seen in the ER today for evaluation of your chest pain and elevated blood pressure.  Your testing fortunately did not show an emergency cause for this.  It is very important you follow-up with your cardiologist for further evaluation.  Continue taking your medications as directed.  Return to the ER for new or worsening symptoms.

## 2024-05-18 NOTE — ED Provider Notes (Signed)
 [facility] Provider Note    Event Date/Time   First MD Initiated Contact with Patient 05/18/24 1649     (approximate)   History   Chest Pain   HPI  Dwayne Ramirez is a 63 year old male with history of A-fib, CHF, nonischemic cardiomyopathy, hypertension presenting to the emergency department for evaluation of chest pain.  Patient had a scheduled appointment with urology today.  While there he noted that he was having some chest discomfort and had a blood pressure reading of 148/121.  In the setting of this it was recommended that he present to the ER.  He reports that he has had intermittent episodes of mild chest discomfort that he attributes to his A-fib and heart failure, but reports that the episode today was more intense.  Describes a tightness in the center of his chest.  Currently denies any chest pain or shortness of breath.  Does have a history of hypertension and reports he has been compliant with his medications.  No headache, numbness or tingling or focal weakness.      Physical Exam   Triage Vital Signs: ED Triage Vitals  Encounter Vitals Group     BP 05/18/24 1603 (!) 141/98     Girls Systolic BP Percentile --      Girls Diastolic BP Percentile --      Boys Systolic BP Percentile --      Boys Diastolic BP Percentile --      Pulse Rate 05/18/24 1603 75     Resp 05/18/24 1603 18     Temp 05/18/24 1603 98.2 F (36.8 C)     Temp Source 05/18/24 1603 Oral     SpO2 05/18/24 1603 98 %     Weight 05/18/24 1603 270 lb (122.5 kg)     Height 05/18/24 1603 6' 3 (1.905 m)     Head Circumference --      Peak Flow --      Pain Score 05/18/24 1616 1     Pain Loc --      Pain Education --      Exclude from Growth Chart --     Most recent vital signs: Vitals:   05/18/24 1658 05/18/24 1900  BP: (!) 161/114 (!) 162/104  Pulse: 86 68  Resp: 17   Temp:    SpO2: 100% 99%     General: Awake, interactive  CV:  Good peripheral  perfusion Resp:  Unlabored respirations, lungs clear to auscultation Abd:  Nondistended.  Neuro:  Symmetric facial movement, fluid speech, 5 out of 5 strength in the bilateral upper and lower extremities   ED Results / Procedures / Treatments   Labs (all labs ordered are listed, but only abnormal results are displayed) Labs Reviewed  BASIC METABOLIC PANEL WITH GFR - Abnormal; Notable for the following components:      Result Value   Glucose, Bld 102 (*)    Creatinine, Ser 1.35 (*)    GFR, Estimated 59 (*)    All other components within normal limits  CBC  TROPONIN T, HIGH SENSITIVITY  TROPONIN T, HIGH SENSITIVITY     EKG EKG independently reviewed and interpreted by myself demonstrates:  EKG demonstrates A-fib at a rate of 86, QRS 120, QTc 406, no acute ST changes  RADIOLOGY Imaging independently reviewed and interpreted by myself demonstrates:  CXR without focal consolidation  Formal Radiology Read:  DG Chest 2 View Result Date: 05/18/2024 CLINICAL DATA:  Chest pain EXAM: DG CHEST  2V COMPARISON:  10/16/2019 FINDINGS: No focal airspace consolidation, pleural effusion, or pneumothorax. Mild cardiomegaly. Tortuous aorta. No acute fracture or destructive lesions. Multilevel thoracic osteophytosis. IMPRESSION: No acute cardiopulmonary abnormality. Electronically Signed   By: Rogelia Myers M.D.   On: 05/18/2024 18:03    PROCEDURES:  Critical Care performed: No  Procedures   MEDICATIONS ORDERED IN ED: Medications - No data to display   IMPRESSION / MDM / ASSESSMENT AND PLAN / ED COURSE  I reviewed the triage vital signs and the nursing notes.  Differential diagnosis includes, but is not limited to, ACS, pneumonia, pneumothorax, very low suspicion PE or dissection based on clinical history and resolved symptoms, very low suspicion hypertensive emergency with moderate blood pressure elevation  Patient's presentation is most consistent with acute presentation with  potential threat to life or bodily function.  63 year old male presenting with chest pain and elevated blood pressure.  BP here only minimally elevated on presentation.  Reassuring CBC, BMP with stable renal impairment.  Troponin negative.  Will obtain repeat troponin given onset of symptoms earlier today.  Repeat troponin returned within normal limits.  Patient reassessed.  No new complaints.  Remains chest pain-free.  Hypertensive but without evidence of hypertensive emergency.  Patient is comfortable with discharge home and outpatient follow-up.  Strict return precautions provided.  Patient discharged in stable condition.     FINAL CLINICAL IMPRESSION(S) / ED DIAGNOSES   Final diagnoses:  Nonspecific chest pain  Elevated blood pressure reading     Rx / DC Orders   ED Discharge Orders          Ordered    Ambulatory referral to Cardiology       Comments: If you have not heard from the Cardiology office within the next 72 hours please call [phone].   05/18/24 1931             Note:  This document was prepared using Dragon voice recognition software and may include unintentional dictation errors.   Levander Slate, MD 05/18/24 [phone]

## 2024-05-18 NOTE — ED Triage Notes (Signed)
 Pt ambulatory to triage.  Pt sent from [facility] for eval of chest pain elevated BP.    Pt taking bp meds.  Pt has chest pain since thi\s am.  No n/v/  no dizziness today.  Pt alert  speech clear.  Hx afib chf

## 2024-05-18 NOTE — ED Notes (Signed)
 Instructed to take scheduled BP meds once he gets home. Patient verbalized understanding.

## 2024-05-21 LAB — CULTURE, URINE COMPREHENSIVE

## 2024-05-23 ENCOUNTER — Ambulatory Visit: Admitting: Cardiovascular Disease

## 2024-05-31 ENCOUNTER — Ambulatory Visit

## 2024-06-07 ENCOUNTER — Ambulatory Visit: Admitting: Urology

## 2024-06-07 ENCOUNTER — Encounter: Payer: Self-pay | Admitting: Urology

## 2024-06-07 VITALS — BP 117/77 | HR 60 | Ht 75.0 in | Wt 268.0 lb

## 2024-06-07 DIAGNOSIS — R310 Gross hematuria: Secondary | ICD-10-CM | POA: Diagnosis not present

## 2024-06-07 NOTE — Patient Instructions (Signed)
 Scheduling number: [phone]

## 2024-06-07 NOTE — Progress Notes (Signed)
° °  06/07/2024  CC:  Chief Complaint  Patient presents with   Cysto    HPI: Refer to Dwayne Ramirez's previous note 05/18/2024.  CT hematuria study was ordered but not yet scheduled  Blood pressure 117/77, pulse 60, height 6' 3 (1.905 m), weight 268 lb (121.6 kg). NED. A&Ox3.   No respiratory distress   Abd soft, NT, ND Normal phallus with bilateral descended testicles  Cystoscopy Procedure Note  Patient identification was confirmed, informed consent was obtained, and patient was prepped using Betadine solution.  Lidocaine jelly was administered per urethral meatus.     Pre-Procedure: - Inspection reveals a normal caliber urethral meatus.  Procedure: The flexible cystoscope was introduced without difficulty - No urethral strictures/lesions are present. - Prominent lateral lobe enlargement prostate with hypervascularity/friability - Elevated bladder neck - Bilateral ureteral orifices identified - Bladder mucosa  reveals no ulcers, tumors, or lesions - No bladder stones - Mild-moderate trabeculation  Retroflexion shows backbleeding from prostate, suboptimal visualization   Post-Procedure: - Patient tolerated the procedure well  Assessment/ Plan: No bladder mucosal abnormalities Prominent BPH with hypervascularity Urine cytology sent Need to schedule CT urogram   Dwayne JAYSON Barba, MD

## 2024-06-08 LAB — URINALYSIS, COMPLETE
Bilirubin, UA: NEGATIVE
Glucose, UA: NEGATIVE
Ketones, UA: NEGATIVE
Leukocytes,UA: NEGATIVE
Nitrite, UA: NEGATIVE
RBC, UA: NEGATIVE
Specific Gravity, UA: 1.03 (ref 1.005–1.030)
Urobilinogen, Ur: 1 mg/dL (ref 0.2–1.0)
pH, UA: 6 (ref 5.0–7.5)

## 2024-06-08 LAB — MICROSCOPIC EXAMINATION

## 2024-06-13 ENCOUNTER — Ambulatory Visit: Payer: Self-pay | Admitting: Urology

## 2024-06-13 LAB — "CYTOLOGY PLUS MONITORING PROFILE: PAP & FEULGEN "

## 2024-06-20 NOTE — Telephone Encounter (Signed)
 Had cystoscopy on 12/15 for hematuria.  CT hematuria study was never scheduled.  It still has not been scheduled.  Please check on status
